# Patient Record
Sex: Female | Born: 1990 | Hispanic: Yes | Marital: Married | Smoking: Never smoker
Health system: Southern US, Community
[De-identification: ages and names within clinical notes are randomized; demographics above are authoritative.]

## PROBLEM LIST (undated history)

## (undated) DIAGNOSIS — Z789 Other specified health status: Secondary | ICD-10-CM

## (undated) HISTORY — PX: NO PAST SURGERIES: SHX2092

---

## 2008-05-27 ENCOUNTER — Ambulatory Visit (HOSPITAL_COMMUNITY): Admission: RE | Admit: 2008-05-27 | Discharge: 2008-05-27 | Payer: Self-pay | Admitting: Family Medicine

## 2008-06-30 ENCOUNTER — Ambulatory Visit (HOSPITAL_COMMUNITY): Admission: RE | Admit: 2008-06-30 | Discharge: 2008-06-30 | Payer: Self-pay | Admitting: Obstetrics & Gynecology

## 2008-07-28 ENCOUNTER — Ambulatory Visit (HOSPITAL_COMMUNITY): Admission: RE | Admit: 2008-07-28 | Discharge: 2008-07-28 | Payer: Self-pay | Admitting: Obstetrics & Gynecology

## 2008-08-26 ENCOUNTER — Inpatient Hospital Stay (HOSPITAL_COMMUNITY): Admission: AD | Admit: 2008-08-26 | Discharge: 2008-08-26 | Payer: Self-pay | Admitting: Obstetrics & Gynecology

## 2008-08-29 ENCOUNTER — Inpatient Hospital Stay (HOSPITAL_COMMUNITY): Admission: AD | Admit: 2008-08-29 | Discharge: 2008-08-29 | Payer: Self-pay | Admitting: Obstetrics & Gynecology

## 2008-08-29 ENCOUNTER — Ambulatory Visit: Payer: Self-pay | Admitting: Obstetrics and Gynecology

## 2008-09-02 ENCOUNTER — Ambulatory Visit (HOSPITAL_COMMUNITY): Admission: RE | Admit: 2008-09-02 | Discharge: 2008-09-02 | Payer: Self-pay | Admitting: Obstetrics & Gynecology

## 2008-09-09 ENCOUNTER — Ambulatory Visit (HOSPITAL_COMMUNITY): Admission: RE | Admit: 2008-09-09 | Discharge: 2008-09-09 | Payer: Self-pay | Admitting: Obstetrics & Gynecology

## 2008-09-12 ENCOUNTER — Inpatient Hospital Stay (HOSPITAL_COMMUNITY): Admission: AD | Admit: 2008-09-12 | Discharge: 2008-09-12 | Payer: Self-pay | Admitting: Gynecology

## 2008-09-16 ENCOUNTER — Ambulatory Visit (HOSPITAL_COMMUNITY): Admission: RE | Admit: 2008-09-16 | Discharge: 2008-09-16 | Payer: Self-pay | Admitting: Obstetrics & Gynecology

## 2008-09-19 ENCOUNTER — Ambulatory Visit (HOSPITAL_COMMUNITY): Admission: RE | Admit: 2008-09-19 | Discharge: 2008-09-19 | Payer: Self-pay | Admitting: Obstetrics and Gynecology

## 2008-09-22 ENCOUNTER — Encounter: Payer: Self-pay | Admitting: *Deleted

## 2008-09-22 ENCOUNTER — Inpatient Hospital Stay (HOSPITAL_COMMUNITY): Admission: AD | Admit: 2008-09-22 | Discharge: 2008-09-25 | Payer: Self-pay | Admitting: Obstetrics & Gynecology

## 2008-09-22 ENCOUNTER — Ambulatory Visit: Payer: Self-pay | Admitting: Family

## 2009-09-28 IMAGING — US US OB DETAIL+14 WK
1 series · 14 of 28 positions shown · non-contrast
Comparison: none

OBSTETRICAL ULTRASOUND:
 This ultrasound exam was performed in the [HOSPITAL] Ultrasound Department.  The OB US report was generated in the AS system, and faxed to the ordering physician.  This report is also available in [REDACTED] PACS.

[Series 1: us ob detail+14 wk · 0.28mm/px · 14 of 97 slices shown]
[im 4/97]
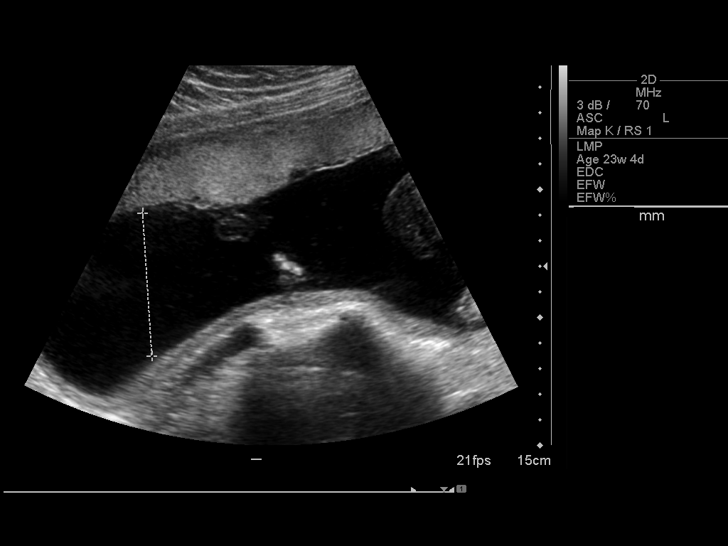
[im 11/97]
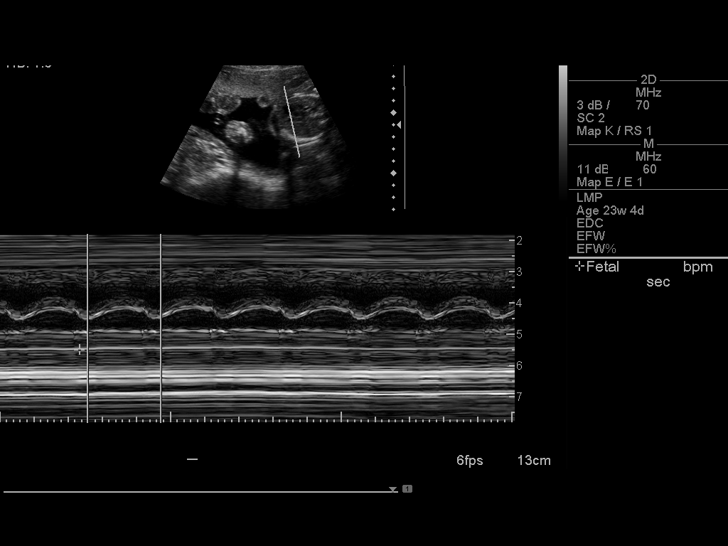
[im 18/97]
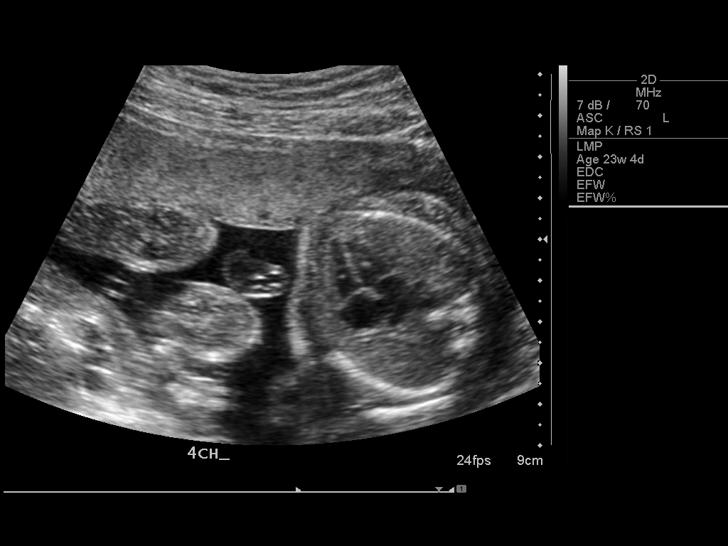
[im 25/97]
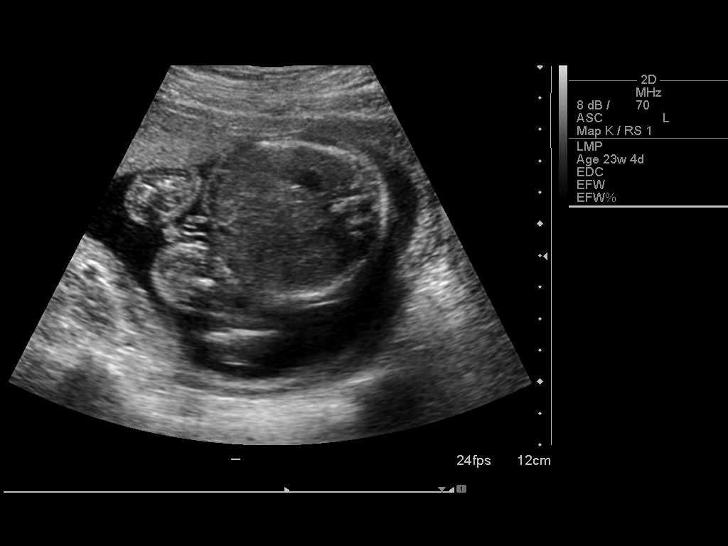
[im 33/97]
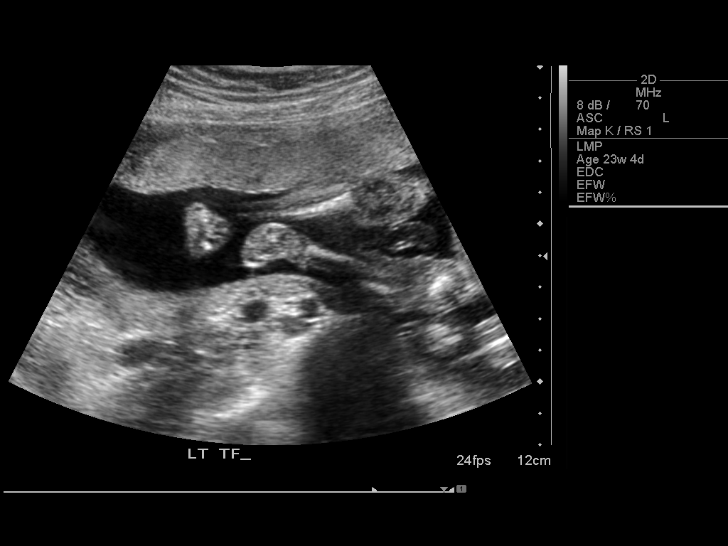
[im 40/97]
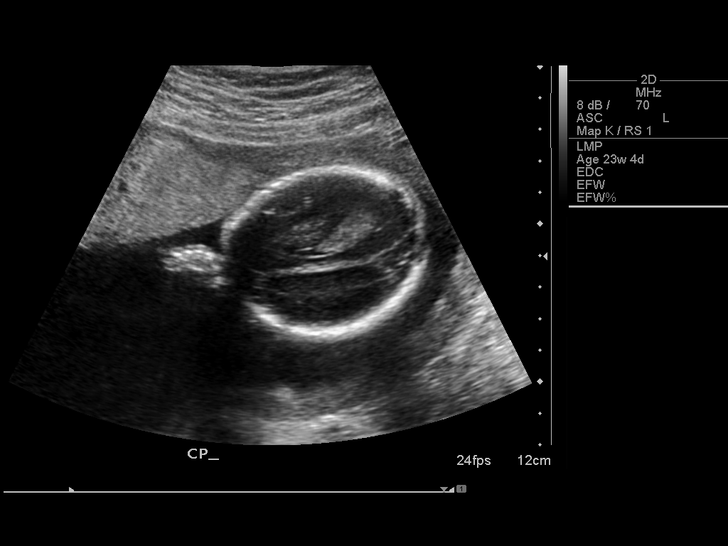
[im 47/97]
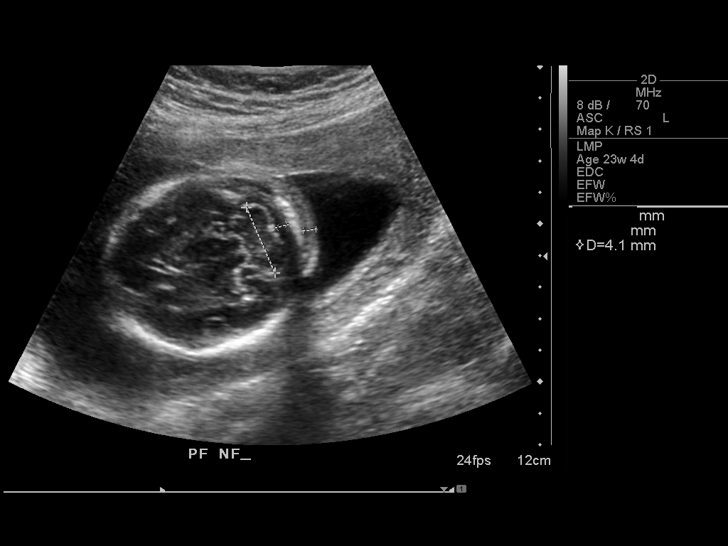
[im 54/97]
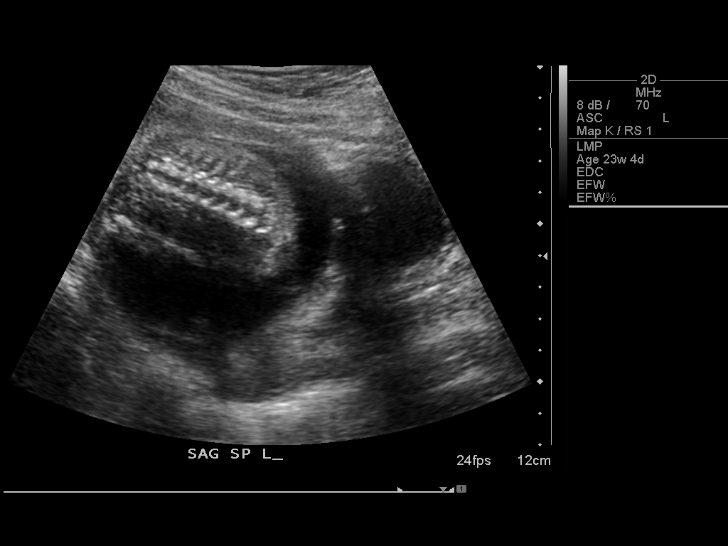
[im 61/97]
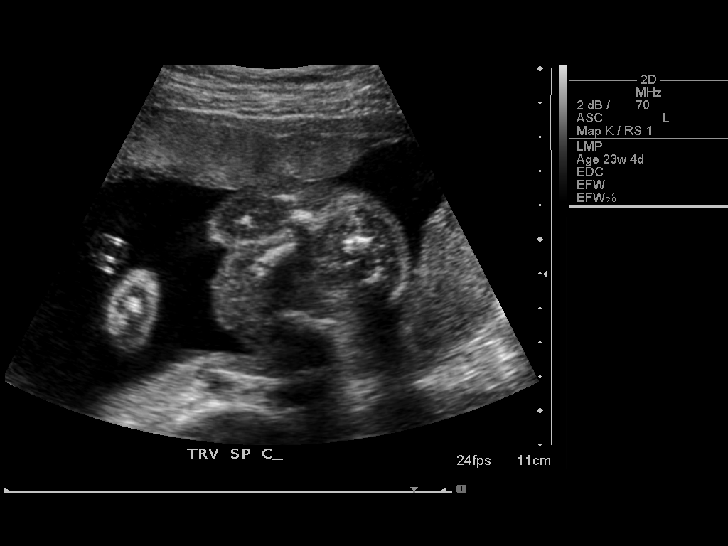
[im 68/97]
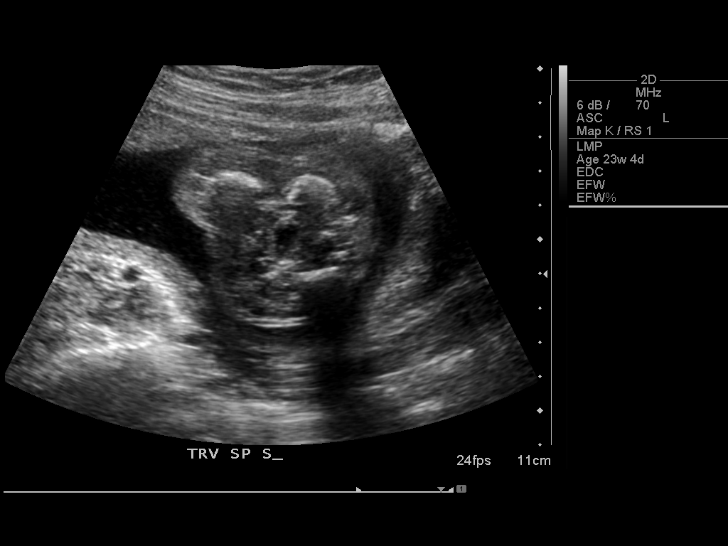
[im 75/97]
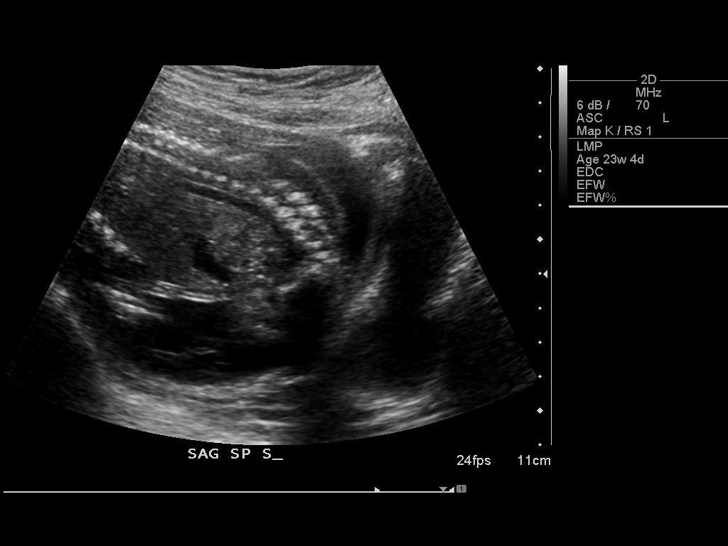
[im 82/97]
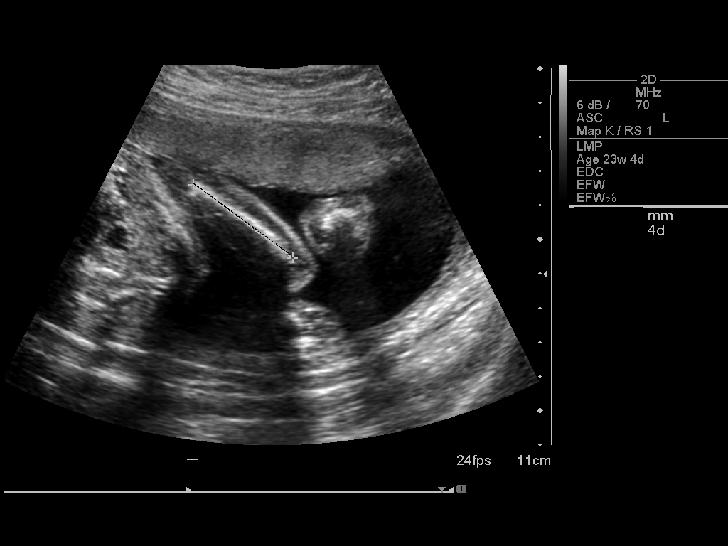
[im 89/97]
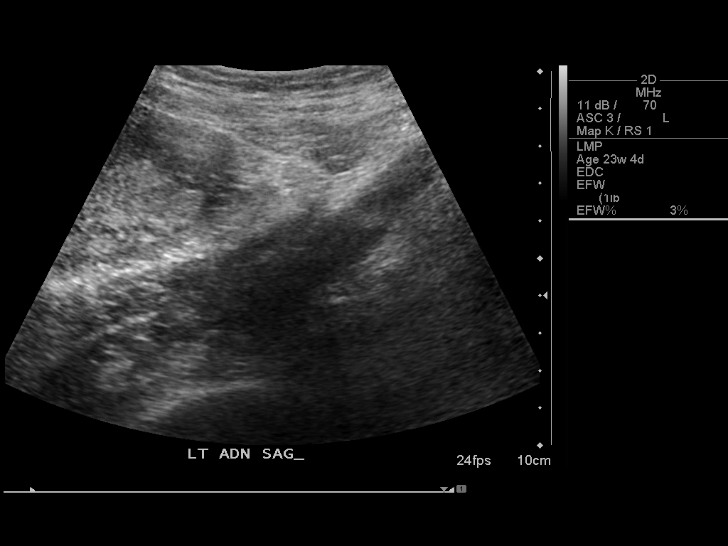
[im 97/97]
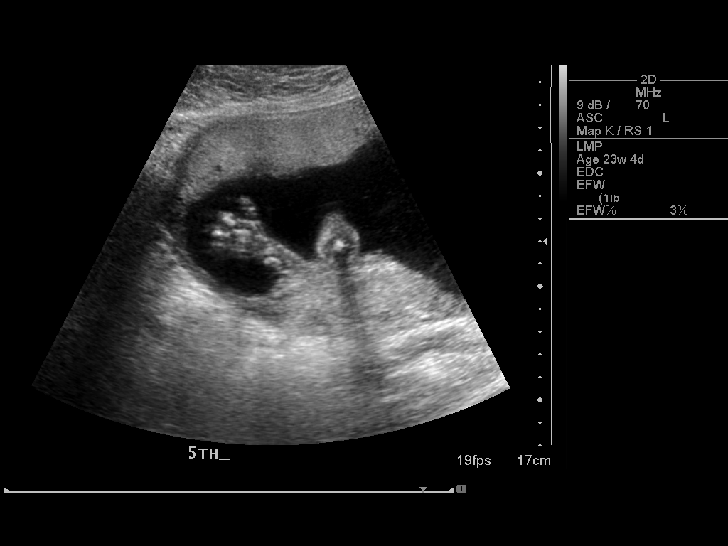

[14 of 28 positions shown; findings below may reference images not displayed]

IMPRESSION: See AS Obstetric US report.

## 2009-12-28 IMAGING — US US OB FOLLOW-UP
1 series · 14 of 28 positions shown · non-contrast
Comparison: none

OBSTETRICAL ULTRASOUND:
 This ultrasound was performed in The [HOSPITAL], and the AS OB/GYN report will be stored to [REDACTED] PACS.

[Series 1: us ob follow-up · 14 of 28 slices shown]
[im 2/28]
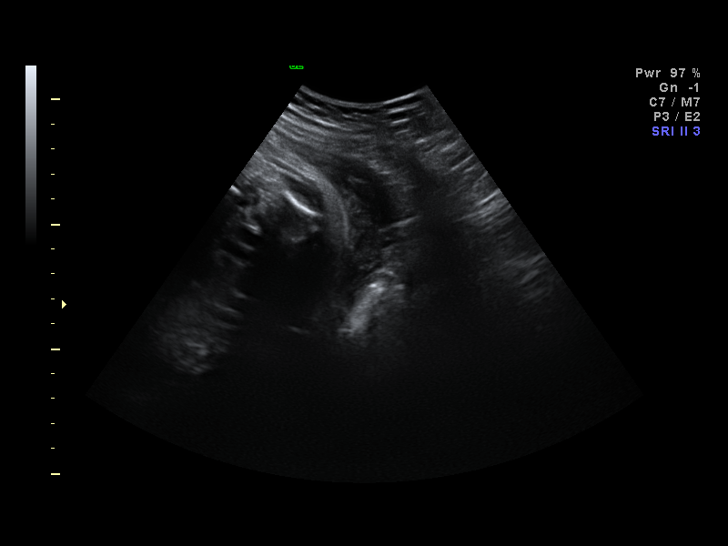
[im 4/28]
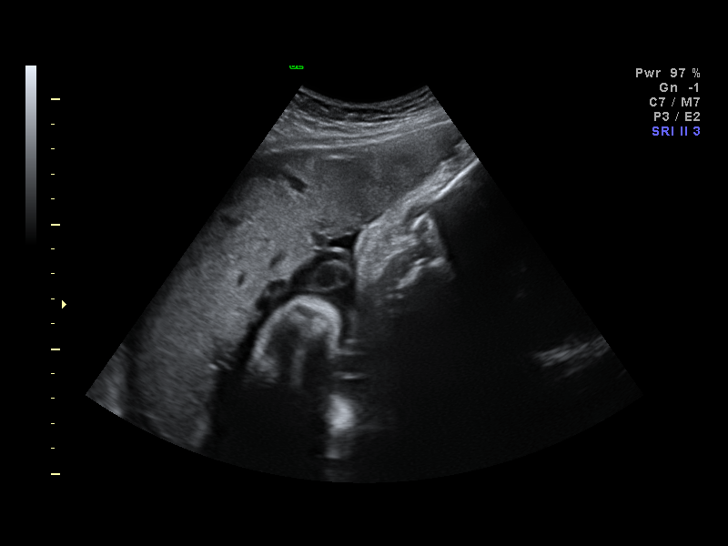
[im 6/28]
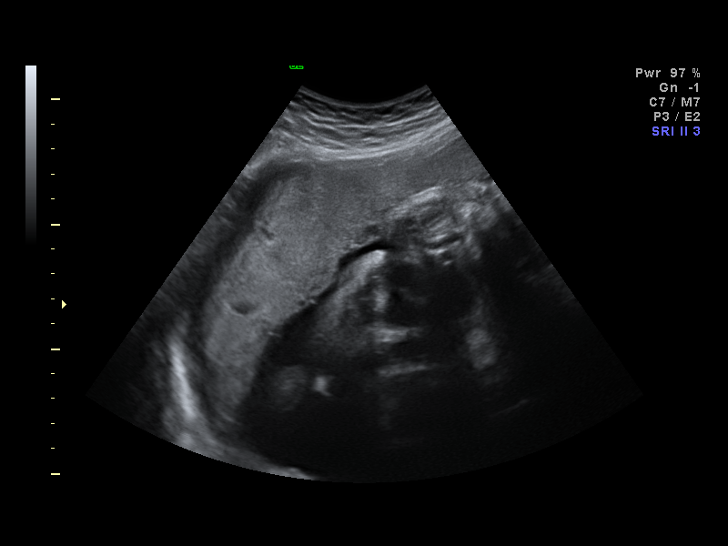
[im 8/28]
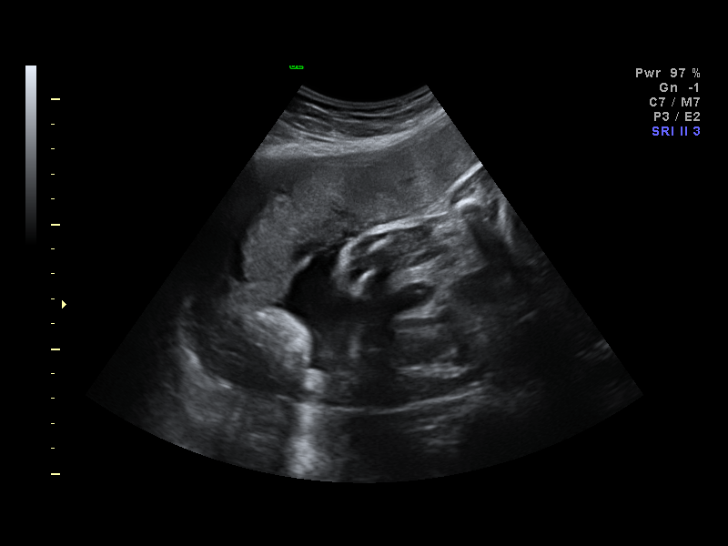
[im 10/28]
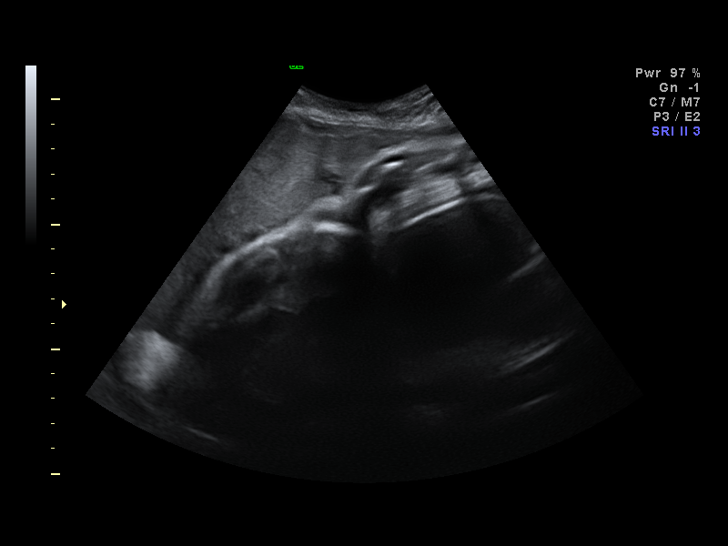
[im 12/28]
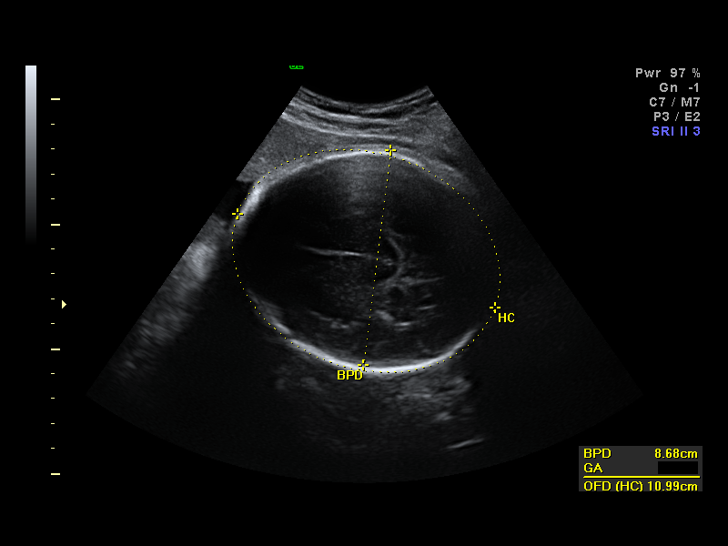
[im 14/28]
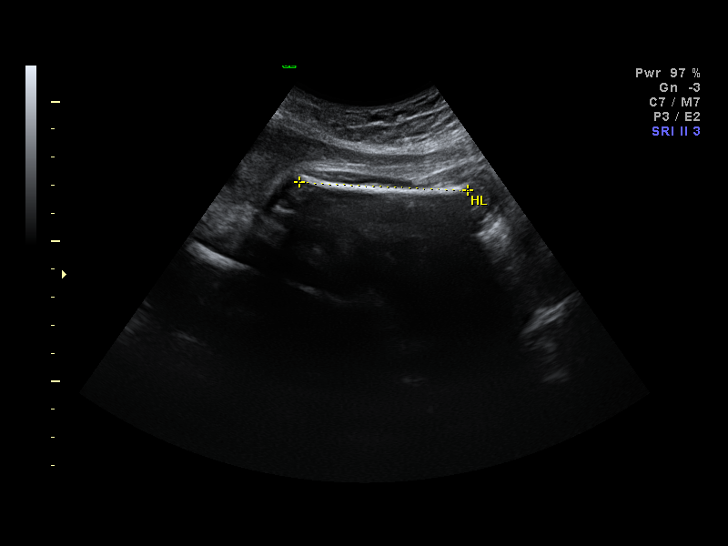
[im 16/28]
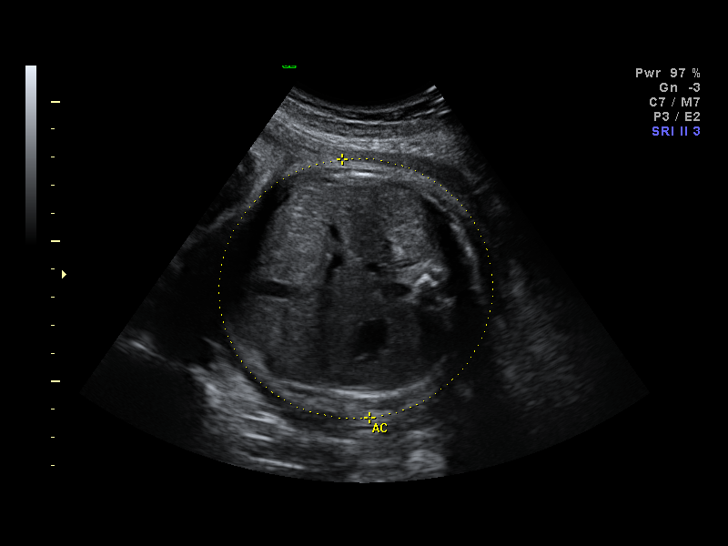
[im 18/28]
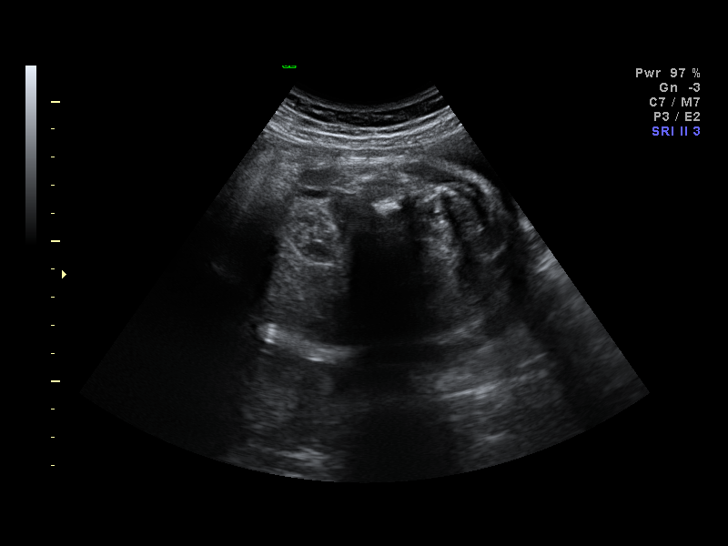
[im 20/28]
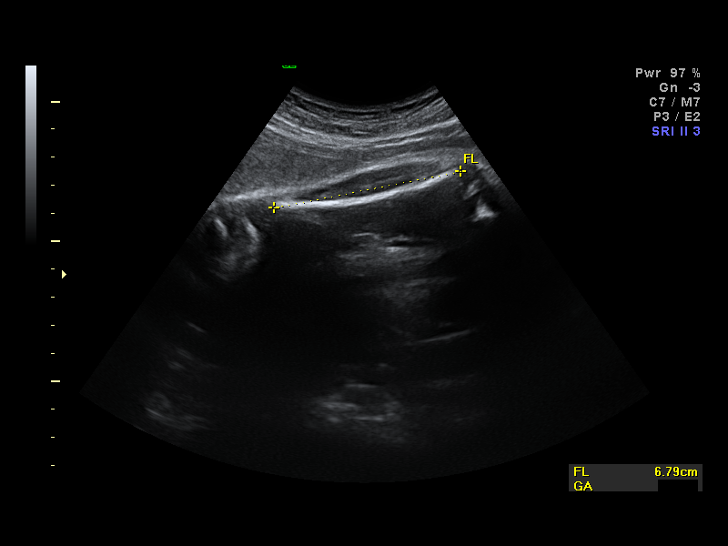
[im 22/28]
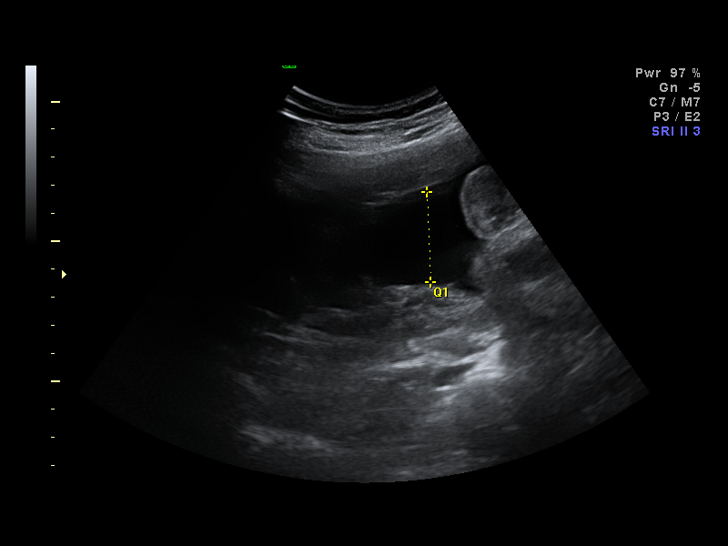
[im 24/28]
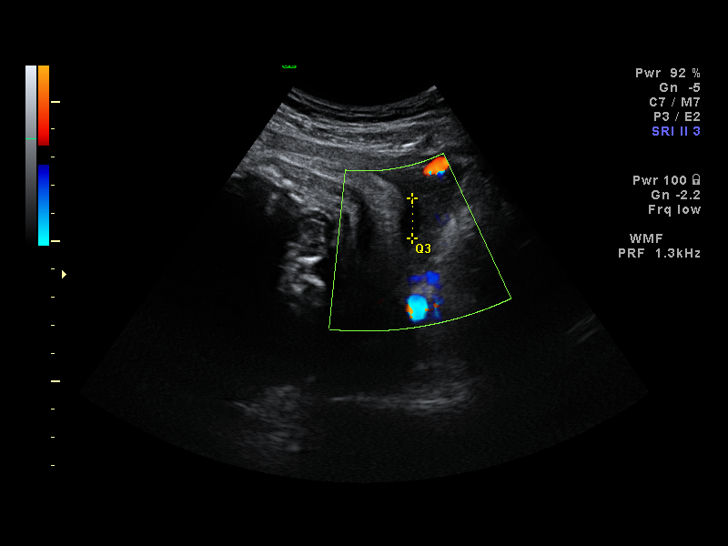
[im 26/28]
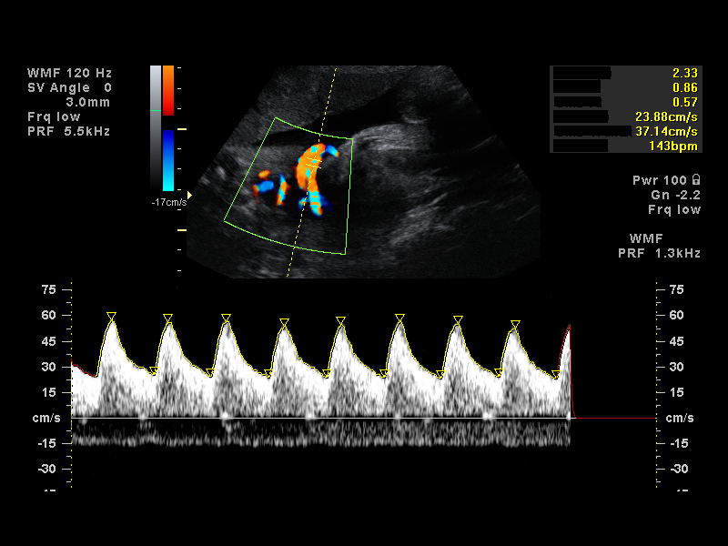
[im 28/28]
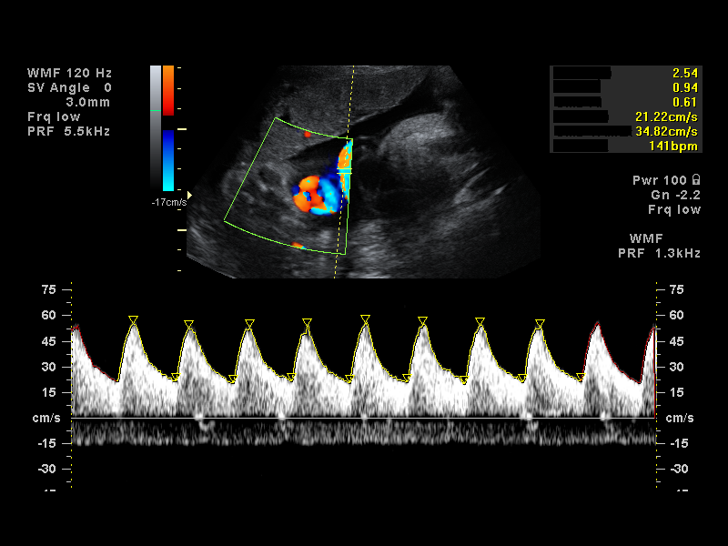

[14 of 28 positions shown; findings below may reference images not displayed]

IMPRESSION: AS OB/GYN has also been faxed to the ordering physician.

## 2010-01-04 IMAGING — US US OB LIMITED
1 series · 14 of 24 positions shown · non-contrast
Comparison: none

OBSTETRICAL ULTRASOUND:
 This ultrasound was performed in The [HOSPITAL], and the AS OB/GYN report will be stored to [REDACTED] PACS.

[Series 1: us ob limited · 14 of 24 slices shown]
[im 1/24]
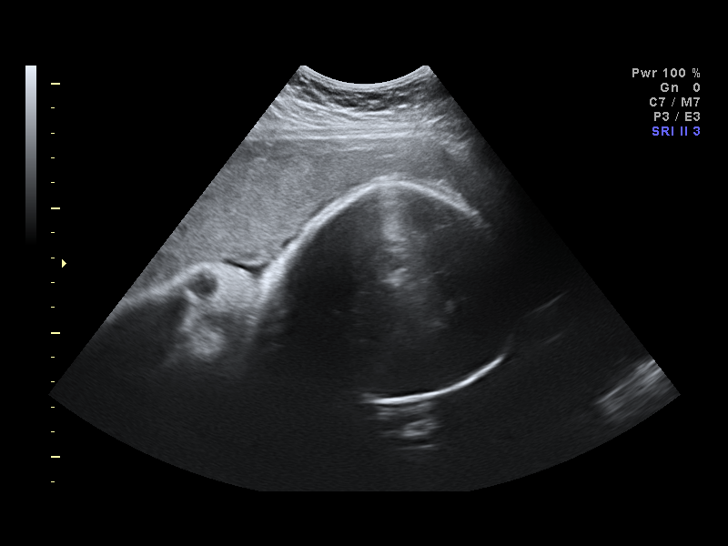
[im 3/24]
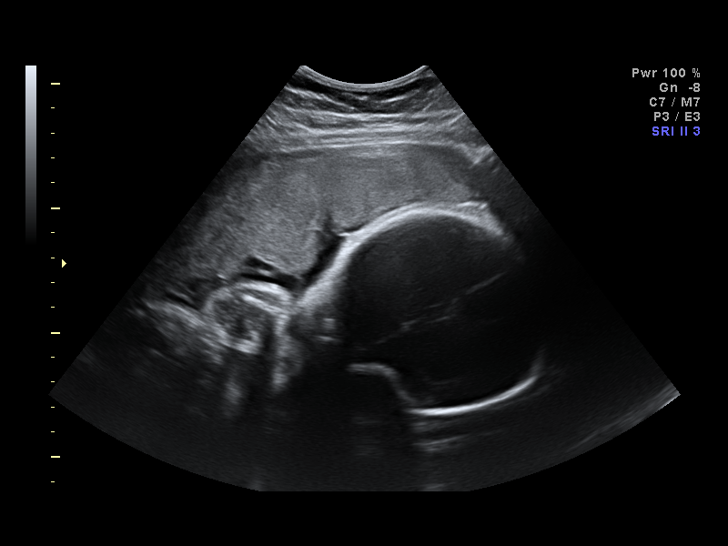
[im 5/24]
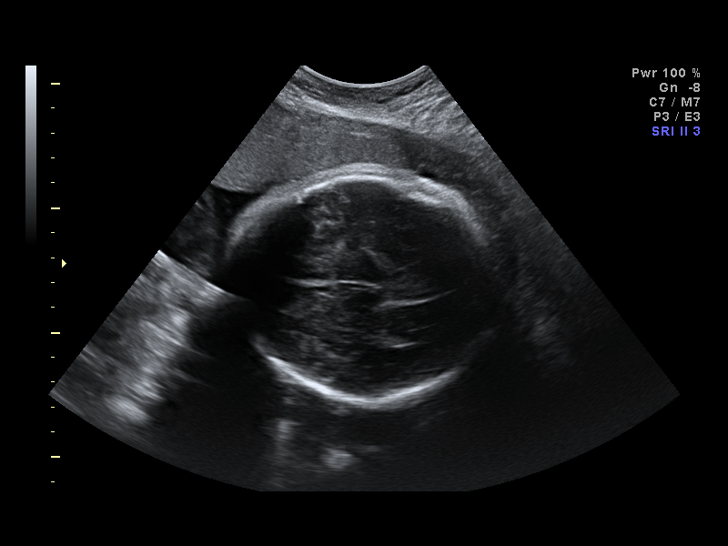
[im 7/24]
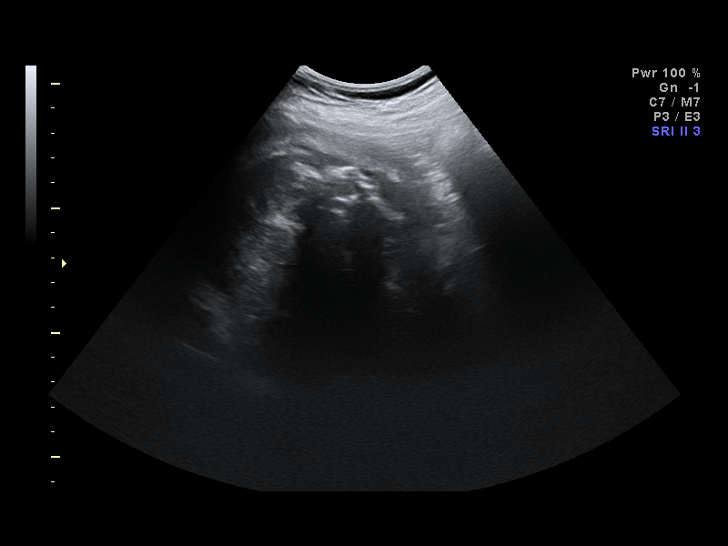
[im 8/24]
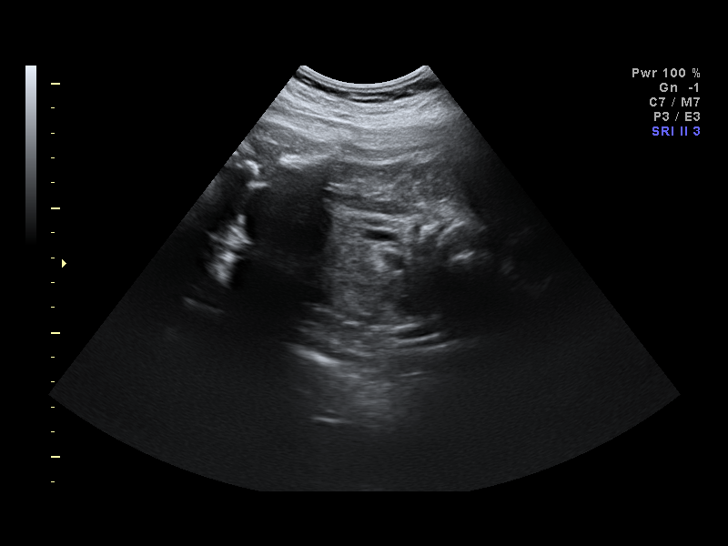
[im 10/24]
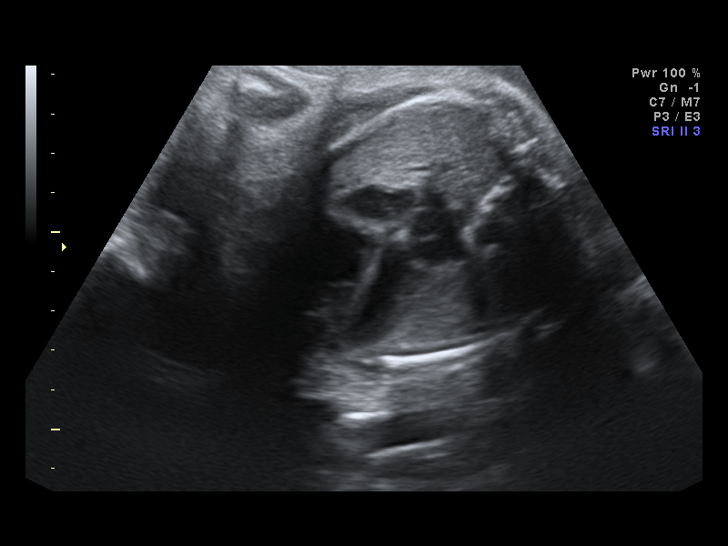
[im 12/24]
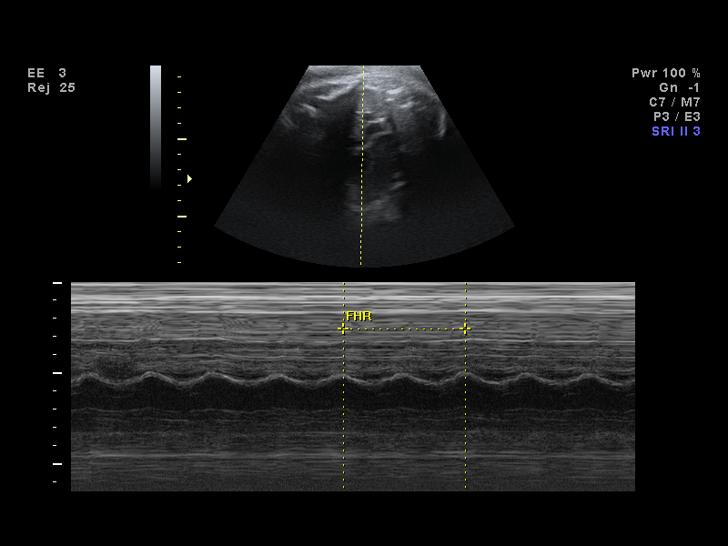
[im 13/24]
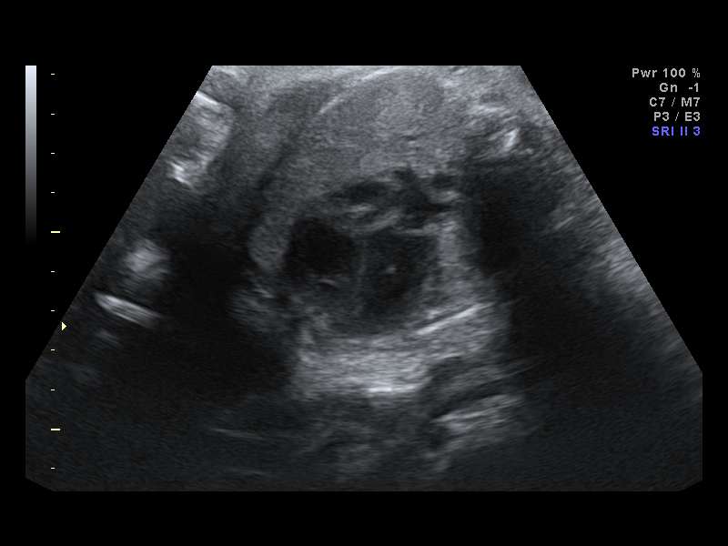
[im 15/24]
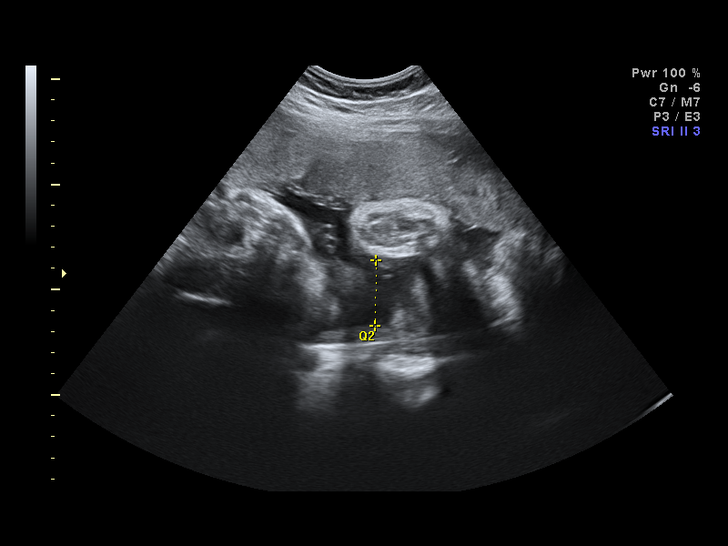
[im 17/24]
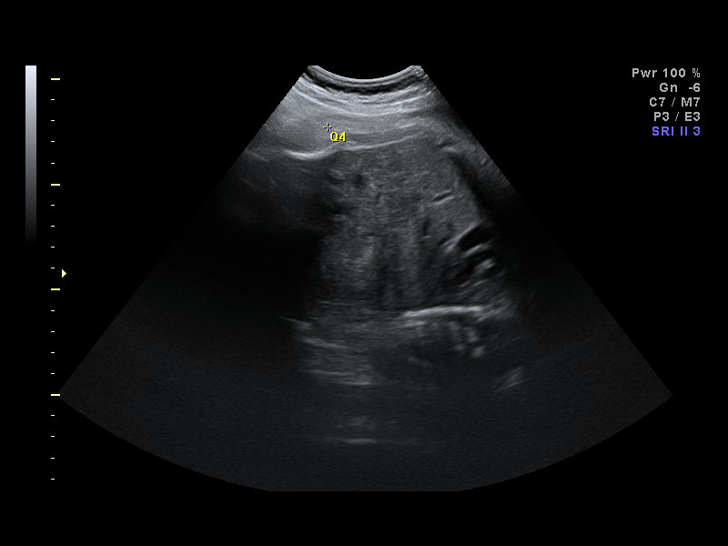
[im 19/24]
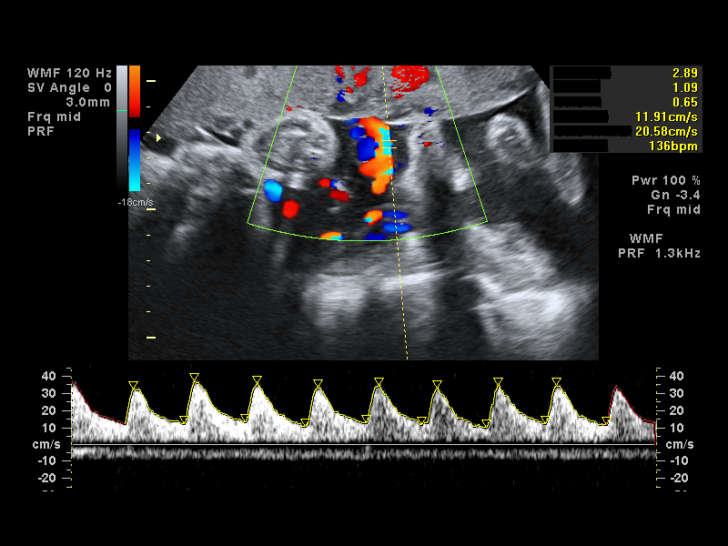
[im 20/24]
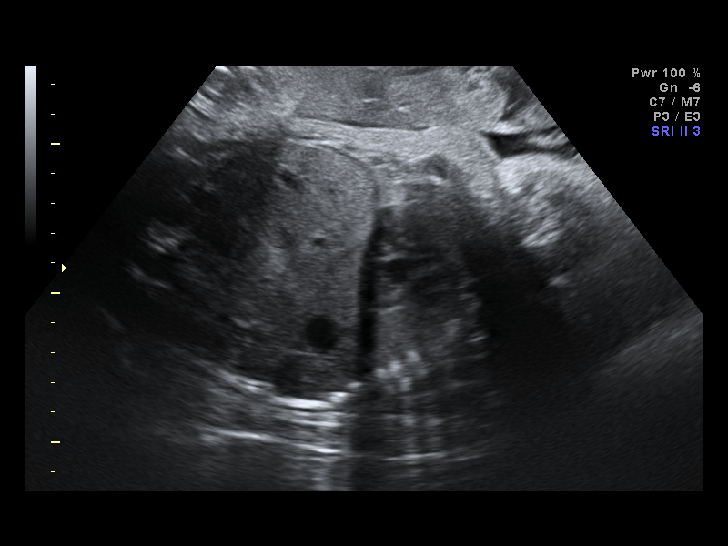
[im 22/24]
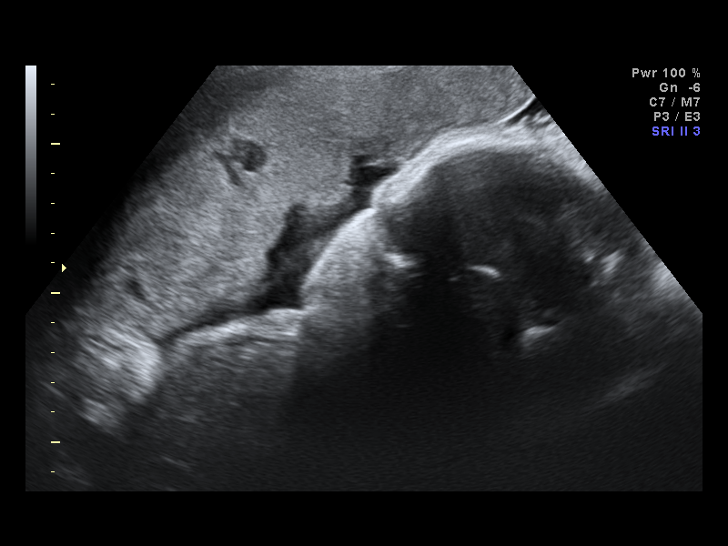
[im 24/24]
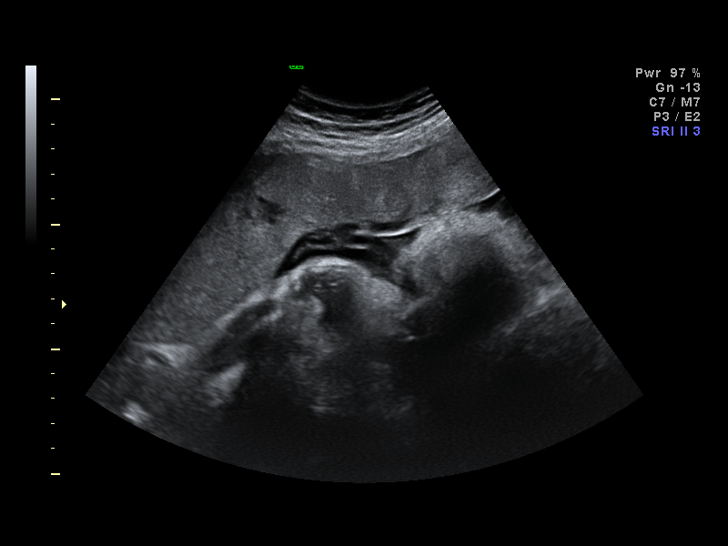

[14 of 24 positions shown; findings below may reference images not displayed]

IMPRESSION: AS OB/GYN has also been faxed to the ordering physician.

## 2011-09-19 LAB — RPR: RPR Ser Ql: NONREACTIVE

## 2011-09-19 LAB — CBC
HCT: 36
Platelets: 171
RBC: 3.13 — ABNORMAL LOW
RBC: 3.85 — ABNORMAL LOW
RDW: 13.4

## 2014-08-26 LAB — OB RESULTS CONSOLE RUBELLA ANTIBODY, IGM: RUBELLA: IMMUNE

## 2014-08-26 LAB — OB RESULTS CONSOLE GC/CHLAMYDIA
CHLAMYDIA, DNA PROBE: NEGATIVE
Gonorrhea: NEGATIVE

## 2014-08-26 LAB — OB RESULTS CONSOLE RPR: RPR: NONREACTIVE

## 2014-08-26 LAB — OB RESULTS CONSOLE ABO/RH: RH Type: POSITIVE

## 2014-08-26 LAB — OB RESULTS CONSOLE HIV ANTIBODY (ROUTINE TESTING): HIV: NONREACTIVE

## 2014-08-26 LAB — OB RESULTS CONSOLE ANTIBODY SCREEN: Antibody Screen: NEGATIVE

## 2014-08-26 LAB — OB RESULTS CONSOLE HEPATITIS B SURFACE ANTIGEN: HEP B S AG: NEGATIVE

## 2014-12-19 NOTE — L&D Delivery Note (Signed)
Delivery Note At 5:34 AM a viable female was delivered via Vaginal, Spontaneous Delivery (Presentation: Left Occiput Anterior).  APGAR: 9, 9; weight  .   Placenta status: Intact, Spontaneous.  Cord: 3 vessels with the following complications: None.  Cord pH: none  Anesthesia: Epidural Local  Episiotomy: None Lacerations: 2nd degree;Perineal Suture Repair: 2.0 chromic Est. Blood Loss (mL): 300  Mom to postpartum.  Baby to Couplet care / Skin to Skin.  Diana Nunez A 03/16/2015, 6:38 AM

## 2015-02-12 LAB — OB RESULTS CONSOLE GBS: GBS: NEGATIVE

## 2015-03-13 ENCOUNTER — Encounter (HOSPITAL_COMMUNITY): Payer: Self-pay | Admitting: *Deleted

## 2015-03-13 ENCOUNTER — Telehealth (HOSPITAL_COMMUNITY): Payer: Self-pay | Admitting: *Deleted

## 2015-03-13 NOTE — Telephone Encounter (Signed)
Preadmission screen Interpreter number [phone]

## 2015-03-14 ENCOUNTER — Inpatient Hospital Stay (HOSPITAL_COMMUNITY)
Admission: AD | Admit: 2015-03-14 | Discharge: 2015-03-14 | Disposition: A | Discharge status: Home / Self Care | Payer: Self-pay | Source: Ambulatory Visit | Attending: Obstetrics | Admitting: Obstetrics

## 2015-03-14 ENCOUNTER — Encounter (HOSPITAL_COMMUNITY): Payer: Self-pay | Admitting: *Deleted

## 2015-03-14 DIAGNOSIS — O4693 Antepartum hemorrhage, unspecified, third trimester: Secondary | ICD-10-CM | POA: Insufficient documentation

## 2015-03-14 DIAGNOSIS — R109 Unspecified abdominal pain: Secondary | ICD-10-CM | POA: Insufficient documentation

## 2015-03-14 DIAGNOSIS — M549 Dorsalgia, unspecified: Secondary | ICD-10-CM | POA: Insufficient documentation

## 2015-03-14 DIAGNOSIS — Z3A40 40 weeks gestation of pregnancy: Secondary | ICD-10-CM | POA: Insufficient documentation

## 2015-03-14 HISTORY — DX: Other specified health status: Z78.9

## 2015-03-14 NOTE — Progress Notes (Signed)
Assisted RN with interpretation of discharge instructions.  °Spanish interpreter  °

## 2015-03-14 NOTE — Progress Notes (Signed)
Assisted Pharmacy Tech with interpretation of medication information.  Spanish interpreter

## 2015-03-14 NOTE — MAU Note (Signed)
Pt reports she had some blood when wiped last night and this morning. C/o some back pain and cramping on and off since last night.

## 2015-03-14 NOTE — Progress Notes (Signed)
Assisted admissions with interpretation of patient registration.  Spanish Interpeter

## 2015-03-14 NOTE — Progress Notes (Signed)
Assisted RN with interpretation of patient assessment.   °Spanish Interpreter °

## 2015-03-15 ENCOUNTER — Encounter (HOSPITAL_COMMUNITY): Payer: Self-pay | Admitting: *Deleted

## 2015-03-15 ENCOUNTER — Inpatient Hospital Stay (HOSPITAL_COMMUNITY)
Admission: AD | Admit: 2015-03-15 | Discharge: 2015-03-17 | DRG: 775 | Disposition: A | Discharge status: Home / Self Care | Payer: Medicaid Other | Source: Ambulatory Visit | Attending: Obstetrics | Admitting: Obstetrics

## 2015-03-15 ENCOUNTER — Inpatient Hospital Stay (HOSPITAL_COMMUNITY): Payer: Medicaid Other | Admitting: Anesthesiology

## 2015-03-15 DIAGNOSIS — Z3A40 40 weeks gestation of pregnancy: Secondary | ICD-10-CM | POA: Diagnosis present

## 2015-03-15 DIAGNOSIS — IMO0001 Reserved for inherently not codable concepts without codable children: Secondary | ICD-10-CM

## 2015-03-15 LAB — CBC
HEMATOCRIT: 36.9 % (ref 36.0–46.0)
Hemoglobin: 12.7 g/dL (ref 12.0–15.0)
MCH: 31.4 pg (ref 26.0–34.0)
MCHC: 34.4 g/dL (ref 30.0–36.0)
MCV: 91.1 fL (ref 78.0–100.0)
PLATELETS: 178 10*3/uL (ref 150–400)
RBC: 4.05 MIL/uL (ref 3.87–5.11)
RDW: 13.8 % (ref 11.5–15.5)
WBC: 11.1 10*3/uL — AB (ref 4.0–10.5)

## 2015-03-15 LAB — TYPE AND SCREEN
ABO/RH(D): O POS
Antibody Screen: NEGATIVE

## 2015-03-15 MED ORDER — PHENYLEPHRINE 40 MCG/ML (10ML) SYRINGE FOR IV PUSH (FOR BLOOD PRESSURE SUPPORT)
PREFILLED_SYRINGE | INTRAVENOUS | Status: AC
  Filled 2015-03-15: qty 20

## 2015-03-15 MED ORDER — OXYCODONE-ACETAMINOPHEN 5-325 MG PO TABS: 1.0000 | ORAL_TABLET | ORAL | Status: DC | PRN

## 2015-03-15 MED ORDER — EPHEDRINE 5 MG/ML INJ
10.0000 mg | INTRAVENOUS | Status: DC | PRN
  Filled 2015-03-15: qty 2

## 2015-03-15 MED ORDER — FENTANYL 2.5 MCG/ML BUPIVACAINE 1/10 % EPIDURAL INFUSION (WH - ANES)
INTRAMUSCULAR | Status: AC
  Filled 2015-03-15: qty 125

## 2015-03-15 MED ORDER — OXYTOCIN 40 UNITS IN LACTATED RINGERS INFUSION - SIMPLE MED
62.5000 mL/h | INTRAVENOUS | Status: DC
  Filled 2015-03-15: qty 1000

## 2015-03-15 MED ORDER — PHENYLEPHRINE 40 MCG/ML (10ML) SYRINGE FOR IV PUSH (FOR BLOOD PRESSURE SUPPORT)
80.0000 ug | PREFILLED_SYRINGE | INTRAVENOUS | Status: DC | PRN
  Filled 2015-03-15: qty 2

## 2015-03-15 MED ORDER — OXYCODONE-ACETAMINOPHEN 5-325 MG PO TABS: 2.0000 | ORAL_TABLET | ORAL | Status: DC | PRN

## 2015-03-15 MED ORDER — LACTATED RINGERS IV SOLN
500.0000 mL | Freq: Once | INTRAVENOUS | Status: AC
  Administered 2015-03-15: 500 mL via INTRAVENOUS

## 2015-03-15 MED ORDER — FENTANYL 2.5 MCG/ML BUPIVACAINE 1/10 % EPIDURAL INFUSION (WH - ANES)
14.0000 mL/h | INTRAMUSCULAR | Status: DC | PRN
  Administered 2015-03-15: 14 mL/h via EPIDURAL

## 2015-03-15 MED ORDER — DIPHENHYDRAMINE HCL 50 MG/ML IJ SOLN: 12.5000 mg | INTRAMUSCULAR | Status: DC | PRN

## 2015-03-15 MED ORDER — LIDOCAINE HCL (PF) 1 % IJ SOLN
30.0000 mL | INTRAMUSCULAR | Status: DC | PRN
  Administered 2015-03-16: 30 mL via SUBCUTANEOUS
  Filled 2015-03-15: qty 30

## 2015-03-15 MED ORDER — FLEET ENEMA 7-19 GM/118ML RE ENEM: 1.0000 | ENEMA | RECTAL | Status: DC | PRN

## 2015-03-15 MED ORDER — LIDOCAINE HCL (PF) 1 % IJ SOLN
INTRAMUSCULAR | Status: DC | PRN
  Administered 2015-03-15 (×2): 4 mL

## 2015-03-15 MED ORDER — ONDANSETRON HCL 4 MG/2ML IJ SOLN: 4.0000 mg | Freq: Four times a day (QID) | INTRAMUSCULAR | Status: DC | PRN

## 2015-03-15 MED ORDER — ACETAMINOPHEN 325 MG PO TABS: 650.0000 mg | ORAL_TABLET | ORAL | Status: DC | PRN

## 2015-03-15 MED ORDER — LACTATED RINGERS IV SOLN
INTRAVENOUS | Status: DC
  Administered 2015-03-15 – 2015-03-16 (×2): via INTRAVENOUS

## 2015-03-15 MED ORDER — OXYTOCIN BOLUS FROM INFUSION
500.0000 mL | INTRAVENOUS | Status: DC
  Administered 2015-03-16: 500 mL via INTRAVENOUS

## 2015-03-15 MED ORDER — CITRIC ACID-SODIUM CITRATE 334-500 MG/5ML PO SOLN: 30.0000 mL | ORAL | Status: DC | PRN

## 2015-03-15 MED ORDER — FENTANYL 2.5 MCG/ML BUPIVACAINE 1/10 % EPIDURAL INFUSION (WH - ANES)
INTRAMUSCULAR | Status: DC | PRN
  Administered 2015-03-15: 14 mL/h via EPIDURAL

## 2015-03-15 MED ORDER — LACTATED RINGERS IV SOLN: 500.0000 mL | INTRAVENOUS | Status: DC | PRN

## 2015-03-15 NOTE — Progress Notes (Signed)
Assisted RN with interpretation of admit into birthing suite.  Spanish Interpreter

## 2015-03-15 NOTE — Progress Notes (Signed)
Assisted RN with interpretation of blood work ordered and IV. Spanish interpreter

## 2015-03-15 NOTE — Anesthesia Procedure Notes (Signed)
Epidural Patient location during procedure: OB Start time: 03/15/2015 11:19 PM  Staffing Anesthesiologist: Mal AmabileFOSTER, Trinidy Masterson Performed by: anesthesiologist   Preanesthetic Checklist Completed: patient identified, site marked, surgical consent, pre-op evaluation, timeout performed, IV checked, risks and benefits discussed and monitors and equipment checked  Epidural Patient position: sitting Prep: site prepped and draped and DuraPrep Patient monitoring: continuous pulse ox and blood pressure Approach: midline Location: L3-L4 Injection technique: LOR air  Needle:  Needle type: Tuohy  Needle gauge: 17 G Needle length: 9 cm and 9 Needle insertion depth: 4 cm Catheter type: closed end flexible Catheter size: 19 Gauge Catheter at skin depth: 9 cm Test dose: negative and Other  Assessment Events: blood not aspirated, injection not painful, no injection resistance, negative IV test and no paresthesia  Additional Notes Patient identified. Risks and benefits discussed including failed block, incomplete  Pain control, post dural puncture headache, nerve damage, paralysis, blood pressure Changes, nausea, vomiting, reactions to medications-both toxic and allergic and post Partum back pain. All questions were answered. Patient expressed understanding and wished to proceed. Sterile technique was used throughout procedure. Epidural site was Dressed with sterile barrier dressing. No paresthesias, signs of intravascular injection Or signs of intrathecal spread were encountered.  Patient was more comfortable after the epidural was dosed. Please see RN's note for documentation of vital signs and FHR which are stable.

## 2015-03-15 NOTE — Progress Notes (Signed)
Assisted admissions with interpretation of registration.  Spanish interpreter

## 2015-03-15 NOTE — Progress Notes (Signed)
Assisted RN with interpretation of patient assessment.   °Spanish Interpreter °

## 2015-03-15 NOTE — Anesthesia Preprocedure Evaluation (Signed)
Anesthesia Evaluation  Patient identified by MRN, date of birth, ID band Patient awake    Reviewed: Allergy & Precautions, Patient's Chart, lab work & pertinent test results  Airway Mallampati: II  TM Distance: >3 FB Neck ROM: Full    Dental no notable dental hx. (+) Teeth Intact   Pulmonary neg pulmonary ROS,  breath sounds clear to auscultation  Pulmonary exam normal       Cardiovascular negative cardio ROS  Rhythm:Regular Rate:Normal     Neuro/Psych negative neurological ROS  negative psych ROS   GI/Hepatic negative GI ROS, Neg liver ROS,   Endo/Other  Obesity  Renal/GU negative Renal ROS  negative genitourinary   Musculoskeletal negative musculoskeletal ROS (+)   Abdominal (+) + obese,   Peds  Hematology negative hematology ROS (+)   Anesthesia Other Findings   Reproductive/Obstetrics (+) Pregnancy                             Anesthesia Physical Anesthesia Plan  ASA: II  Anesthesia Plan: Epidural   Post-op Pain Management:    Induction:   Airway Management Planned: Natural Airway  Additional Equipment:   Intra-op Plan:   Post-operative Plan:   Informed Consent: I have reviewed the patients History and Physical, chart, labs and discussed the procedure including the risks, benefits and alternatives for the proposed anesthesia with the patient or authorized representative who has indicated his/her understanding and acceptance.     Plan Discussed with: Anesthesiologist  Anesthesia Plan Comments:         Anesthesia Quick Evaluation

## 2015-03-15 NOTE — H&P (Signed)
Diana Nunez is a 24 y.o. female presenting for UC's. Maternal Medical History:  Reason for admission: Contractions.   Fetal activity: Perceived fetal activity is normal.   Last perceived fetal movement was within the past hour.    Prenatal complications: no prenatal complications Prenatal Complications - Diabetes: none.    OB History    Gravida Para Term Preterm AB TAB SAB Ectopic Multiple Living   2 1 1       1      Past Medical History  Diagnosis Date  . Medical history non-contributory    Past Surgical History  Procedure Laterality Date  . No past surgeries     Family History: family history is not on file. Social History:  reports that she has never smoked. She does not have any smokeless tobacco history on file. She reports that she does not drink alcohol or use illicit drugs.   Prenatal Transfer Tool  Maternal Diabetes: No Genetic Screening: Denied Maternal Ultrasounds/Referrals: Normal Fetal Ultrasounds or other Referrals:  None Maternal Substance Abuse:  No Significant Maternal Medications:  None Significant Maternal Lab Results:  None Other Comments:  None  ROS  Dilation: 4.5 Effacement (%): 100 Station: -2 Exam by:: Elie ConferK. Weiss RN Blood pressure 115/76, pulse 93, temperature 98 F (36.7 C), resp. rate 16, height 5\' 2"  (1.575 m), weight 183 lb (83.008 kg), last menstrual period 06/09/2014. Maternal Exam:  Abdomen: Patient reports no abdominal tenderness. Fetal presentation: vertex  Introitus: Normal vulva. Normal vagina.  Pelvis: adequate for delivery.   Cervix: Cervix evaluated by digital exam.     Physical Exam  Nursing note and vitals reviewed. Constitutional: She is oriented to person, place, and time. She appears well-developed and well-nourished.  HENT:  Head: Normocephalic and atraumatic.  Eyes: Conjunctivae are normal. Pupils are equal, round, and reactive to light.  Neck: Normal range of motion. Neck supple.  Cardiovascular:  Normal rate and regular rhythm.   Respiratory: Effort normal and breath sounds normal.  GI: Soft.  Genitourinary: Vagina normal and uterus normal.  Musculoskeletal: Normal range of motion.  Neurological: She is alert and oriented to person, place, and time.  Skin: Skin is warm and dry.  Psychiatric: She has a normal mood and affect. Her behavior is normal. Judgment and thought content normal.    Prenatal labs: ABO, Rh: O/Positive/-- (09/08 0000) Antibody: Negative (09/08 0000) Rubella: Immune (09/08 0000) RPR: Nonreactive (09/08 0000)  HBsAg: Negative (09/08 0000)  HIV: Non-reactive (09/08 0000)  GBS: Negative (02/25 0000)   Assessment/Plan: 40.6 weeks.  Active labor.  Admit.   Kreed Kauffman A 03/15/2015, 10:19 PM

## 2015-03-16 ENCOUNTER — Encounter (HOSPITAL_COMMUNITY): Payer: Self-pay | Admitting: *Deleted

## 2015-03-16 LAB — ABO/RH: ABO/RH(D): O POS

## 2015-03-16 LAB — RPR: RPR Ser Ql: NONREACTIVE

## 2015-03-16 MED ORDER — OXYCODONE-ACETAMINOPHEN 5-325 MG PO TABS
1.0000 | ORAL_TABLET | ORAL | Status: DC | PRN
  Administered 2015-03-16 – 2015-03-17 (×2): 1 via ORAL
  Filled 2015-03-16 (×2): qty 1

## 2015-03-16 MED ORDER — INFLUENZA VAC SPLIT QUAD 0.5 ML IM SUSY
0.5000 mL | PREFILLED_SYRINGE | INTRAMUSCULAR | Status: AC
  Administered 2015-03-17: 0.5 mL via INTRAMUSCULAR
  Filled 2015-03-16: qty 0.5

## 2015-03-16 MED ORDER — SIMETHICONE 80 MG PO CHEW: 80.0000 mg | CHEWABLE_TABLET | ORAL | Status: DC | PRN

## 2015-03-16 MED ORDER — DIBUCAINE 1 % RE OINT: 1.0000 "application " | TOPICAL_OINTMENT | RECTAL | Status: DC | PRN

## 2015-03-16 MED ORDER — ONDANSETRON HCL 4 MG/2ML IJ SOLN: 4.0000 mg | INTRAMUSCULAR | Status: DC | PRN

## 2015-03-16 MED ORDER — PRENATAL MULTIVITAMIN CH
1.0000 | ORAL_TABLET | Freq: Every day | ORAL | Status: DC
  Administered 2015-03-16 – 2015-03-17 (×2): 1 via ORAL
  Filled 2015-03-16 (×2): qty 1

## 2015-03-16 MED ORDER — ZOLPIDEM TARTRATE 5 MG PO TABS: 5.0000 mg | ORAL_TABLET | Freq: Every evening | ORAL | Status: DC | PRN

## 2015-03-16 MED ORDER — TETANUS-DIPHTH-ACELL PERTUSSIS 5-2.5-18.5 LF-MCG/0.5 IM SUSP
0.5000 mL | Freq: Once | INTRAMUSCULAR | Status: DC
Start: 2015-03-17 — End: 2015-03-17

## 2015-03-16 MED ORDER — IBUPROFEN 600 MG PO TABS
600.0000 mg | ORAL_TABLET | Freq: Four times a day (QID) | ORAL | Status: DC
  Administered 2015-03-16 – 2015-03-17 (×5): 600 mg via ORAL
  Filled 2015-03-16 (×5): qty 1

## 2015-03-16 MED ORDER — OXYCODONE-ACETAMINOPHEN 5-325 MG PO TABS: 2.0000 | ORAL_TABLET | ORAL | Status: DC | PRN

## 2015-03-16 MED ORDER — DIPHENHYDRAMINE HCL 25 MG PO CAPS: 25.0000 mg | ORAL_CAPSULE | Freq: Four times a day (QID) | ORAL | Status: DC | PRN

## 2015-03-16 MED ORDER — LANOLIN HYDROUS EX OINT: TOPICAL_OINTMENT | CUTANEOUS | Status: DC | PRN

## 2015-03-16 MED ORDER — WITCH HAZEL-GLYCERIN EX PADS: 1.0000 "application " | MEDICATED_PAD | CUTANEOUS | Status: DC | PRN

## 2015-03-16 MED ORDER — BENZOCAINE-MENTHOL 20-0.5 % EX AERO: 1.0000 "application " | INHALATION_SPRAY | CUTANEOUS | Status: DC | PRN

## 2015-03-16 MED ORDER — OXYTOCIN 40 UNITS IN LACTATED RINGERS INFUSION - SIMPLE MED: 62.5000 mL/h | INTRAVENOUS | Status: DC | PRN

## 2015-03-16 MED ORDER — ONDANSETRON HCL 4 MG PO TABS: 4.0000 mg | ORAL_TABLET | ORAL | Status: DC | PRN

## 2015-03-16 MED ORDER — TETANUS-DIPHTH-ACELL PERTUSSIS 5-2.5-18.5 LF-MCG/0.5 IM SUSP
0.5000 mL | Freq: Once | INTRAMUSCULAR | Status: AC
  Administered 2015-03-16: 0.5 mL via INTRAMUSCULAR
  Filled 2015-03-16: qty 0.5

## 2015-03-16 MED ORDER — SENNOSIDES-DOCUSATE SODIUM 8.6-50 MG PO TABS
2.0000 | ORAL_TABLET | ORAL | Status: DC
  Administered 2015-03-17: 2 via ORAL
  Filled 2015-03-16: qty 2

## 2015-03-16 MED ORDER — ACETAMINOPHEN 325 MG PO TABS: 650.0000 mg | ORAL_TABLET | ORAL | Status: DC | PRN

## 2015-03-16 NOTE — Anesthesia Postprocedure Evaluation (Signed)
Anesthesia Post Note  Patient: Diana Nunez  Procedure(s) Performed: * No procedures listed *  Anesthesia type: Epidural  Patient location: Mother/Baby  Post pain: Pain level controlled  Post assessment: Post-op Vital signs reviewed  Last Vitals:  Filed Vitals:   03/16/15 1250  BP: 125/56  Pulse: 84  Temp: 37.1 C  Resp: 18    Post vital signs: Reviewed  Level of consciousness:alert  Complications: No apparent anesthesia complications

## 2015-03-16 NOTE — Progress Notes (Signed)
I assisted ArtistMargaret RN from Lactation with some information and feeding upgrades, by [employer] PipelineViria Alvarez Spanish Interpreter

## 2015-03-16 NOTE — Progress Notes (Signed)
Diana Nunez is a 24 y.o. G2P1001 at 4734w0d by LMP admitted for active labor  Subjective:   Objective: BP 114/69 mmHg  Pulse 77  Temp(Src) 98.9 F (37.2 C) (Oral)  Resp 18  Ht 5\' 2"  (1.575 m)  Wt 183 lb (83.008 kg)  BMI 33.46 kg/m2  SpO2 100%  LMP 06/09/2014      FHT:  FHR: 140 bpm, variability: moderate,  accelerations:  Present,  decelerations:  Absent UC:   regular, every 2-4 minutes SVE:   Dilation: 6 Effacement (%): 100 Station: -1 Exam by:: ansah-mensah, rnc   Labs: Lab Results  Component Value Date   WBC 11.1* 03/15/2015   HGB 12.7 03/15/2015   HCT 36.9 03/15/2015   MCV 91.1 03/15/2015   PLT 178 03/15/2015    Assessment / Plan: Spontaneous labor, progressing normally  Labor: Progressing normally Preeclampsia:  n/a Fetal Wellbeing:  Category I Pain Control:  Epidural I/D:  n/a Anticipated MOD:  NSVD  [name] A 03/16/2015, 2:14 AM

## 2015-03-16 NOTE — Lactation Note (Addendum)
This note was copied from the chart of Girl Valarie Conesandelaria Garcia-Salazar. Lactation Consultation Note  Patient Name: [name] LKGMW'NToday's Date: 03/16/2015 Reason for consult: Initial assessment;Other (Comment) ([facility] Spanish interpreter Vitia , Onalee Hualvarez present )  Baby is 5212 hours old and has been to the breast several times and has been supplementing with formula  Per moms request. Per mom baby last fed at 3pm for 50 mins and supplement with 10 ml of formula after wards.  Presently baby is being held by family member, sleeping and mom is eating dinner. LC discussed supply and demand and recommended offering both breast at a feeding. If the baby seems satisfied  To hold off supplementing and allow the baby to get hungry and re-latch at the breast .  Mother informed of post-discharge support and given phone number to the lactation department, including services  for phone call assistance; out-patient appointments; and breastfeeding support group. List of other breastfeeding resources  in the community given in the handout. Encouraged mother to call for problems or concerns related to breastfeeding. Mom is aware to call for latch assist on nurses light.    Maternal Data    Feeding Feeding Type:  (updated doc flow sheets , per mom last fed at 3pm ) Length of feed: 50 min (per mom per spanish interpreter fed 25 mins each breast )  LATCH Score/Interventions                      Lactation Tools Discussed/Used WIC Program: Yes ([facility] also attended the BF class )   Consult Status Consult Status: Follow-up Date: 03/17/15 Follow-up type: In-patient    Kathrin Greathouseorio, Sherry Rogus Ann 03/16/2015, 6:07 PM

## 2015-03-16 NOTE — Progress Notes (Signed)
I stopped by to check on patient's needs and ordered her lunch.  Eda H Royal Interpreter. °

## 2015-03-16 NOTE — Progress Notes (Signed)
I stopped by patient's room to check on her needs, I ordered her dinner, snack and breakfast, by Orlan LeavensViria Alvarez Spanish Interpreter.

## 2015-03-16 NOTE — Progress Notes (Signed)
UR chart review completed.  

## 2015-03-17 ENCOUNTER — Ambulatory Visit: Payer: Self-pay

## 2015-03-17 ENCOUNTER — Inpatient Hospital Stay (HOSPITAL_COMMUNITY): Admission: RE | Admit: 2015-03-17 | Payer: Self-pay | Source: Ambulatory Visit

## 2015-03-17 LAB — CBC
HCT: 32.9 % — ABNORMAL LOW (ref 36.0–46.0)
Hemoglobin: 11.2 g/dL — ABNORMAL LOW (ref 12.0–15.0)
MCH: 31.7 pg (ref 26.0–34.0)
MCHC: 34 g/dL (ref 30.0–36.0)
MCV: 93.2 fL (ref 78.0–100.0)
Platelets: 129 10*3/uL — ABNORMAL LOW (ref 150–400)
RBC: 3.53 MIL/uL — ABNORMAL LOW (ref 3.87–5.11)
RDW: 13.7 % (ref 11.5–15.5)
WBC: 11 10*3/uL — AB (ref 4.0–10.5)

## 2015-03-17 NOTE — Discharge Instructions (Signed)
Discharge instructions   You can wash your hair  Shower  Eat what you want  Drink what you want  See me in 6 weeks  Your ankles are going to swell more in the next 2 weeks than when pregnant  No sex for 6 weeks   Lynanne Delgreco A, MD 03/17/2015

## 2015-03-17 NOTE — Lactation Note (Signed)
This note was copied from the chart of Diana Valarie Conesandelaria Nunez. Lactation Consultation Note  Patient Name: Diana Nunez ZOXWR'UToday's Date: 03/17/2015 Reason for consult: Follow-up assessment of this mom and baby, now 38 hours pp.  Baby receiving phototx and mom has decided now to pump and bottle-feed and is now obtaining more ebm, with recent feedings of 2 and 4 ml's and additional supplement with 10 ml's of formula.  Mom states that she is pumping q3h with DEBP which LC encouraged her to continue for maximum milk production.   Maternal Data Reason for exclusion: Mother's choice to formula and breast feed on admission  Feeding    LATCH Score/Interventions           LATCH score of "6" after midnight but no further LATCH scores;  Baby did breastfed at 0330 and 0500 and since then has received only bottle-feedings with ebm and/or formula           Lactation Tools Discussed/Used   DEBP q3h   Consult Status Consult Status: Follow-up Date: 03/18/15 Follow-up type: In-patient    Warrick ParisianBryant, Charmika Macdonnell [facility] 03/17/2015, 8:25 PM

## 2015-03-17 NOTE — Progress Notes (Signed)
I assisted Bethann Berkshirerisha, RN with explanation of care plan for the Baby and ordered patient's lunch.  Eda H Royal Interpreter.

## 2015-03-17 NOTE — Progress Notes (Signed)
I assisted Bethann Berkshirerisha, RN with teaching. Eda H Royal  Interpreter.

## 2015-03-17 NOTE — Discharge Summary (Signed)
Obstetric Discharge Summary Reason for Admission: onset of labor Prenatal Procedures: none Intrapartum Procedures: spontaneous vaginal delivery Postpartum Procedures: none Complications-Operative and Postpartum: none HEMOGLOBIN  Date Value Ref Range Status  03/15/2015 12.7 12.0 - 15.0 g/dL Final   HCT  Date Value Ref Range Status  03/15/2015 36.9 36.0 - 46.0 % Final    Physical Exam:  General: alert Lochia: appropriate Uterine Fundus: firm Incision: healing well DVT Evaluation: No evidence of DVT seen on physical exam.  Discharge Diagnoses: Term Pregnancy-delivered  Discharge Information: Date: 03/17/2015 Activity: pelvic rest Diet: routine Medications: Iron and Percocet Condition: improved Instructions: refer to practice specific booklet Discharge to: home Follow-up Information    Follow up with Kathreen CosierMARSHALL,Keagon Glascoe A, MD.   Specialty:  Obstetrics and Gynecology   Contact information:   [address] [place] [address] [phone]       Newborn Data: Live born female  Birth Weight: 7 lb 8.8 oz (3425 g) APGAR: 9, 9  Home with mother.  Ayeza Therriault A 03/17/2015, 6:37 AM

## 2015-03-17 NOTE — Progress Notes (Signed)
Patient ID: Diana ConesCandelaria Garcia-Salazar, female   DOB: 11/20/1991, 24 y.o.   MRN: [id number] Postpartum day one Vital signs normal Fundus firm Lochia moderate Wants early discharge

## 2015-03-18 ENCOUNTER — Ambulatory Visit: Payer: Self-pay

## 2015-03-18 NOTE — Lactation Note (Signed)
This note was copied from the chart of Diana Valarie Conesandelaria Nunez. Lactation Consultation Note: Mothers breast are filling. Assist mother with pumping. Mother has bilateral positional strips from shallow latching. Mother has an appt with WIC on April 6 . She has been pumping every 2-3 hours . Father states that infant fed at 8 am. 15ml of formula.  Advised parents to page for assistance with next feeding. Discussed the use of a nipple shield when latching infant. [facility]. Will follow up with Spanish interpreter with all teaching.  Patient Name: Diana Nunez NWGNF'AToday's Date: 03/18/2015 Reason for consult: Follow-up assessment   Maternal Data    Feeding Feeding Type: Bottle Fed - Formula Nipple Type: Slow - flow  LATCH Score/Interventions                      Lactation Tools Discussed/Used     Consult Status      Stevan BornKendrick, Yvonne Petite McCoy 03/18/2015, 10:08 AM

## 2016-10-21 LAB — GLUCOSE, POCT (MANUAL RESULT ENTRY): POC GLUCOSE: 101 mg/dL — AB (ref 70–99)

## 2017-10-24 ENCOUNTER — Encounter (HOSPITAL_COMMUNITY): Payer: Self-pay | Admitting: Emergency Medicine

## 2017-10-24 ENCOUNTER — Emergency Department (HOSPITAL_COMMUNITY)
Admission: EM | Admit: 2017-10-24 | Discharge: 2017-10-24 | Disposition: A | Discharge status: Home / Self Care | Payer: Self-pay | Attending: Emergency Medicine | Admitting: Emergency Medicine

## 2017-10-24 DIAGNOSIS — G2581 Restless legs syndrome: Secondary | ICD-10-CM | POA: Insufficient documentation

## 2017-10-24 DIAGNOSIS — G245 Blepharospasm: Secondary | ICD-10-CM | POA: Insufficient documentation

## 2017-10-24 MED ORDER — BACLOFEN 5 MG PO TABS: 5.0000 mg | ORAL_TABLET | Freq: Three times a day (TID) | ORAL | 0 refills | Status: DC

## 2017-10-24 NOTE — ED Notes (Signed)
See PA note for physical assessment  

## 2017-10-24 NOTE — ED Provider Notes (Signed)
[facility] Provider Note   CSN: [id number] Arrival date & time: 10/24/17  1810     History   Chief Complaint Chief Complaint  Patient presents with  . Eye Problem    HPI Diana Nunez is a 26 y.o. female.  Patient reports spasm of the lower right eyelid, intermittent over the last month. She denies visual disturbance, blurred vision, eye pain, eye drainage, eye irritation. Daily headache that is relieved with Excedrin. No extremity weakness. No nausea, vomiting. No fever. No neck pain. She endorses increased stress. She also reports involutary movement of legs at night when trying to sleep.   The history is provided by the patient. A language interpreter was used.  Eye Problem   This is a recurrent problem. The current episode started more than 1 week ago. The problem occurs daily. The problem has been gradually worsening. There is a problem in the right eye. There was no injury mechanism. The patient is experiencing no pain. There is no history of trauma to the eye. There is no known exposure to pink eye. Pertinent negatives include no numbness, no blurred vision, no decreased vision, no discharge, no double vision, no foreign body sensation, no photophobia, no eye redness, no nausea, no vomiting, no weakness and no itching.    Past Medical History:  Diagnosis Date  . Medical history non-contributory     Patient Active Problem List   Diagnosis Date Noted  . NSVD (normal spontaneous vaginal delivery) 03/16/2015  . Active labor 03/15/2015    Past Surgical History:  Procedure Laterality Date  . NO PAST SURGERIES      OB History    Gravida Para Term Preterm AB Living   2 2 2     2    SAB TAB Ectopic Multiple Live Births         0 2       Home Medications    Prior to Admission medications   Not on File    Family History No family history on file.  Social History Social History   Tobacco Use  . Smoking  status: Never Smoker  . Smokeless tobacco: Never Used  Substance Use Topics  . Alcohol use: No  . Drug use: No     Allergies   Patient has no known allergies.   Review of Systems Review of Systems  Eyes: Negative for blurred vision, double vision, photophobia, discharge and redness.  Gastrointestinal: Negative for nausea and vomiting.  Skin: Negative for itching.  Neurological: Positive for headaches. Negative for dizziness, facial asymmetry, speech difficulty, weakness and numbness.  All other systems reviewed and are negative.    Physical Exam Updated Vital Signs BP (!) 106/55 (BP Location: Right Arm)   Pulse 88   Temp 98.8 F (37.1 C) (Oral)   Resp 17   Ht 5\' 5"  (1.651 m)   Wt 70.3 kg (155 lb)   LMP 08/24/2017   SpO2 100%   BMI 25.79 kg/m   Physical Exam  Constitutional: She appears well-developed and well-nourished. No distress.  HENT:  Head: Normocephalic and atraumatic.  Eyes: Conjunctivae and EOM are normal. Pupils are equal, round, and reactive to light. Right eye exhibits no discharge. Left eye exhibits no discharge. No scleral icterus.  Neck: Normal range of motion. Neck supple.  Cardiovascular: Normal rate and regular rhythm.  No murmur heard. Pulmonary/Chest: Effort normal and breath sounds normal. No respiratory distress.  Abdominal: Soft. There is no tenderness.  Musculoskeletal: Normal  range of motion. She exhibits no edema.  Lymphadenopathy:    She has no cervical adenopathy.  Neurological: She is alert. She has normal strength. No cranial nerve deficit or sensory deficit.  Skin: Skin is warm and dry.  Psychiatric: She has a normal mood and affect.  Nursing note and vitals reviewed.    ED Treatments / Results  Labs (all labs ordered are listed, but only abnormal results are displayed) Labs Reviewed - No data to display  EKG  EKG Interpretation None       Radiology No results found.  Procedures Procedures (including critical care  time)  Medications Ordered in ED Medications - No data to display   Initial Impression / Assessment and Plan / ED Course  I have reviewed the triage vital signs and the nursing notes.  Pertinent labs & imaging results that were available during my care of the patient were reviewed by me and considered in my medical decision making (see chart for details).    Spanish interpreter utilized to obtain HPI and ROS. Patient with spasms of the muscles of the lower right eyelid. Pt is afebrile with no focal neuro deficits, nuchal rigidity, or change in vision. Pt is to follow up with [facility]. Care instructions for blepharospasm and restless legs provided. Return precautions discussed. Pt verbalizes understanding and is agreeable with plan to dc.   Final Clinical Impressions(s) / ED Diagnoses   Final diagnoses:  Blepharospasm  Restless legs    ED Discharge Orders        Ordered    baclofen 5 MG TABS  3 times daily     10/24/17 1913       Felicie Morn, NP 10/24/17 1946    Gwyneth Sprout, MD 10/24/17 2315

## 2017-10-24 NOTE — ED Triage Notes (Signed)
Husband/translator stated, she have problem with her right eye to close for about 4 weeks.

## 2017-10-24 NOTE — ED Notes (Signed)
PT states understanding of care given, follow up care. PT is ambulated from ED to car with a steady gait. 

## 2017-10-30 ENCOUNTER — Emergency Department (HOSPITAL_COMMUNITY)
Admission: EM | Admit: 2017-10-30 | Discharge: 2017-10-30 | Disposition: A | Discharge status: Home / Self Care | Payer: Self-pay | Attending: Emergency Medicine | Admitting: Emergency Medicine

## 2017-10-30 ENCOUNTER — Emergency Department (HOSPITAL_COMMUNITY): Payer: Self-pay

## 2017-10-30 ENCOUNTER — Encounter (HOSPITAL_COMMUNITY): Payer: Self-pay | Admitting: Emergency Medicine

## 2017-10-30 DIAGNOSIS — H5711 Ocular pain, right eye: Secondary | ICD-10-CM | POA: Insufficient documentation

## 2017-10-30 LAB — CBC WITH DIFFERENTIAL/PLATELET
BASOS ABS: 0 10*3/uL (ref 0.0–0.1)
Basophils Relative: 0 %
Eosinophils Absolute: 0.4 10*3/uL (ref 0.0–0.7)
Eosinophils Relative: 3 %
HEMATOCRIT: 42.3 % (ref 36.0–46.0)
Hemoglobin: 14.9 g/dL (ref 12.0–15.0)
LYMPHS ABS: 3 10*3/uL (ref 0.7–4.0)
Lymphocytes Relative: 23 %
MCH: 32 pg (ref 26.0–34.0)
MCHC: 35.2 g/dL (ref 30.0–36.0)
MCV: 90.8 fL (ref 78.0–100.0)
MONO ABS: 1.3 10*3/uL — AB (ref 0.1–1.0)
MONOS PCT: 10 %
NEUTROS ABS: 8.3 10*3/uL — AB (ref 1.7–7.7)
Neutrophils Relative %: 64 %
PLATELETS: 204 10*3/uL (ref 150–400)
RBC: 4.66 MIL/uL (ref 3.87–5.11)
RDW: 13.2 % (ref 11.5–15.5)
WBC: 13 10*3/uL — ABNORMAL HIGH (ref 4.0–10.5)

## 2017-10-30 LAB — BASIC METABOLIC PANEL
Anion gap: 7 (ref 5–15)
BUN: 10 mg/dL (ref 6–20)
CALCIUM: 9.5 mg/dL (ref 8.9–10.3)
CO2: 24 mmol/L (ref 22–32)
Chloride: 107 mmol/L (ref 101–111)
Creatinine, Ser: 0.68 mg/dL (ref 0.44–1.00)
GFR calc Af Amer: >60 mL/min (ref >60)
GLUCOSE: 99 mg/dL (ref 65–99)
Potassium: 4.7 mmol/L (ref 3.5–5.1)
SODIUM: 138 mmol/L (ref 135–145)

## 2017-10-30 LAB — TSH: TSH: 5.295 u[IU]/mL — ABNORMAL HIGH (ref 0.350–4.500)

## 2017-10-30 NOTE — Discharge Instructions (Signed)
Follow-up with ophthalmology if your symptoms are not improving in the next week.  Contact information for Dr. Genia DelMincey has been provided in this discharge summary for you to call and make these arrangements as needed.

## 2017-10-30 NOTE — ED Notes (Signed)
Pt departed in NAD, refused use of wheelchair.  

## 2017-10-30 NOTE — ED Provider Notes (Signed)
[facility]   CSN: [id number] Arrival date & time: 10/30/17  0000     History   Chief Complaint Chief Complaint  Patient presents with  . Eye Problem    HPI Diana Nunez is a 26 y.o. female.  Patient is a 26 year old female with no significant past medical history presenting with complaints of right eye pain, swelling, and twitching.  This is been ongoing for the past several days.  She was seen here and diagnosed with blepharospasm, and discharged home.  She returns today stating that her symptoms are worse and she now feels as though something is pushing on the back of her eye.  She denies any visual disturbances.  She denies any fevers or chills.   The history is provided by the patient.    Past Medical History:  Diagnosis Date  . Medical history non-contributory     Patient Active Problem List   Diagnosis Date Noted  . NSVD (normal spontaneous vaginal delivery) 03/16/2015  . Active labor 03/15/2015    Past Surgical History:  Procedure Laterality Date  . NO PAST SURGERIES      OB History    Gravida Para Term Preterm AB Living   2 2 2     2    SAB TAB Ectopic Multiple Live Births         0 2       Home Medications    Prior to Admission medications   Medication Sig Start Date End Date Taking? Authorizing Provider  baclofen 5 MG TABS Take 5 mg 3 (three) times daily by mouth. 10/24/17   Felicie MornSmith, David, NP    Family History No family history on file.  Social History Social History   Tobacco Use  . Smoking status: Never Smoker  . Smokeless tobacco: Never Used  Substance Use Topics  . Alcohol use: No  . Drug use: No     Allergies   Patient has no known allergies.   Review of Systems Review of Systems  All other systems reviewed and are negative.    Physical Exam Updated Vital Signs BP 130/74 (BP Location: Right Arm)   Pulse 74   Temp 98.4 F (36.9 C) (Oral)   Resp 16    SpO2 100%   Physical Exam  Constitutional: She is oriented to person, place, and time. She appears well-developed and well-nourished. No distress.  HENT:  Head: Normocephalic and atraumatic.  Eyes: Pupils are equal, round, and reactive to light.  The right eye appears grossly normal.  Pupils are equally round and reactive.  Extraocular muscles are intact.  There is no diplopia with upward or downward gaze.  Neck: Normal range of motion. Neck supple.  Pulmonary/Chest: Effort normal.  Neurological: She is alert and oriented to person, place, and time.  Skin: Skin is warm and dry. She is not diaphoretic.  Nursing note and vitals reviewed.    ED Treatments / Results  Labs (all labs ordered are listed, but only abnormal results are displayed) Labs Reviewed  BASIC METABOLIC PANEL  CBC WITH DIFFERENTIAL/PLATELET  TSH    EKG  EKG Interpretation None       Radiology No results found.  Procedures Procedures (including critical care time)  Medications Ordered in ED Medications - No data to display   Initial Impression / Assessment and Plan / ED Course  I have reviewed the triage vital signs and the nursing notes.  Pertinent labs & imaging results that were  available during my care of the patient were reviewed by me and considered in my medical decision making (see chart for details).  Patient presents with twitching and bulging of her right eye.  She is taken several pictures of herself where she feels as though her right eye is more protuberant than the left.  She is also complaining of "twitching" of her right lower eyelid.  I am unable to appreciate these on exam.  CT scan shows no evidence for retrobulbar abnormality or proptosis.  Her laboratory studies are unremarkable.  She will be discharged, to follow-up with her primary doctor if not improving.  Final Clinical Impressions(s) / ED Diagnoses   Final diagnoses:  None    ED Discharge Orders    None       Geoffery Lyonselo,  Asante Blanda, MD 10/30/17 [phone]

## 2017-10-30 NOTE — ED Triage Notes (Signed)
Patient reports right eye discomfort for 4 weeks , described as shaking/trembling , appears bigger than left , denies pain / no blurred vision , pt. Also requesting sleeping pill for her insomnia and anxiety. Interpreter service utilized at triage .

## 2017-10-30 NOTE — ED Notes (Signed)
Spoke to [place] in main lab and blood for pt not received.   Blood was tubed to main lab approximately 0245 from pod A tube station due to not having any tubes in mini lab.

## 2017-10-30 NOTE — ED Notes (Signed)
Patient transported to CT 

## 2017-10-30 NOTE — ED Notes (Signed)
supv Thomas from main lab in room and apologized to pt abt not receiving her blood.  I recollected blood....blood given to supv Thomas to take to main lab to process.

## 2018-02-09 ENCOUNTER — Other Ambulatory Visit: Payer: Self-pay

## 2018-02-09 ENCOUNTER — Emergency Department (HOSPITAL_COMMUNITY)
Admission: EM | Admit: 2018-02-09 | Discharge: 2018-02-10 | Disposition: A | Discharge status: Home / Self Care | Payer: No Typology Code available for payment source | Attending: Emergency Medicine | Admitting: Emergency Medicine

## 2018-02-09 ENCOUNTER — Emergency Department (HOSPITAL_COMMUNITY): Payer: No Typology Code available for payment source

## 2018-02-09 ENCOUNTER — Encounter (HOSPITAL_COMMUNITY): Payer: Self-pay

## 2018-02-09 DIAGNOSIS — R079 Chest pain, unspecified: Secondary | ICD-10-CM | POA: Insufficient documentation

## 2018-02-09 DIAGNOSIS — M542 Cervicalgia: Secondary | ICD-10-CM | POA: Insufficient documentation

## 2018-02-09 LAB — POC URINE PREG, ED: Preg Test, Ur: NEGATIVE

## 2018-02-09 MED ORDER — CYCLOBENZAPRINE HCL 10 MG PO TABS: 10.0000 mg | ORAL_TABLET | Freq: Two times a day (BID) | ORAL | 0 refills | Status: DC | PRN

## 2018-02-09 MED ORDER — IOPAMIDOL (ISOVUE-300) INJECTION 61%
INTRAVENOUS | Status: AC
  Administered 2018-02-09: 75 mL
  Filled 2018-02-09: qty 75

## 2018-02-09 MED ORDER — IBUPROFEN 600 MG PO TABS: 600.0000 mg | ORAL_TABLET | Freq: Four times a day (QID) | ORAL | 0 refills | Status: DC | PRN

## 2018-02-09 MED ORDER — MORPHINE SULFATE (PF) 4 MG/ML IV SOLN
4.0000 mg | Freq: Once | INTRAVENOUS | Status: AC
  Administered 2018-02-09: 4 mg via INTRAVENOUS
  Filled 2018-02-09: qty 1

## 2018-02-09 NOTE — ED Notes (Signed)
Pt unable to void at this time. 
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
## 2018-02-09 NOTE — ED Notes (Signed)
Taken to xray at this time. 

## 2019-06-06 ENCOUNTER — Encounter (HOSPITAL_COMMUNITY): Payer: Self-pay | Admitting: Emergency Medicine

## 2019-06-06 ENCOUNTER — Other Ambulatory Visit: Payer: Self-pay

## 2019-06-06 ENCOUNTER — Emergency Department (HOSPITAL_COMMUNITY)
Admission: EM | Admit: 2019-06-06 | Discharge: 2019-06-07 | Disposition: A | Discharge status: Home / Self Care | Payer: HRSA Program | Attending: Emergency Medicine | Admitting: Emergency Medicine

## 2019-06-06 ENCOUNTER — Emergency Department (HOSPITAL_COMMUNITY): Payer: HRSA Program

## 2019-06-06 DIAGNOSIS — Z79899 Other long term (current) drug therapy: Secondary | ICD-10-CM | POA: Insufficient documentation

## 2019-06-06 DIAGNOSIS — Z20822 Contact with and (suspected) exposure to covid-19: Secondary | ICD-10-CM

## 2019-06-06 DIAGNOSIS — R0789 Other chest pain: Secondary | ICD-10-CM | POA: Diagnosis not present

## 2019-06-06 DIAGNOSIS — Z20828 Contact with and (suspected) exposure to other viral communicable diseases: Secondary | ICD-10-CM

## 2019-06-06 LAB — CBC
HCT: 43.8 % (ref 36.0–46.0)
Hemoglobin: 14.4 g/dL (ref 12.0–15.0)
MCH: 30.8 pg (ref 26.0–34.0)
MCHC: 32.9 g/dL (ref 30.0–36.0)
MCV: 93.8 fL (ref 80.0–100.0)
Platelets: 343 10*3/uL (ref 150–400)
RBC: 4.67 MIL/uL (ref 3.87–5.11)
RDW: 12.2 % (ref 11.5–15.5)
WBC: 13.4 10*3/uL — ABNORMAL HIGH (ref 4.0–10.5)
nRBC: 0 % (ref 0.0–0.2)

## 2019-06-06 LAB — TROPONIN I: Troponin I: <0.03 ng/mL (ref <0.03)

## 2019-06-06 LAB — BASIC METABOLIC PANEL
Anion gap: 10 (ref 5–15)
BUN: 14 mg/dL (ref 6–20)
CO2: 24 mmol/L (ref 22–32)
Calcium: 9.5 mg/dL (ref 8.9–10.3)
Chloride: 105 mmol/L (ref 98–111)
Creatinine, Ser: 0.75 mg/dL (ref 0.44–1.00)
GFR calc Af Amer: >60 mL/min (ref >60)
GFR calc non Af Amer: >60 mL/min (ref >60)
Glucose, Bld: 100 mg/dL — ABNORMAL HIGH (ref 70–99)
Potassium: 3.9 mmol/L (ref 3.5–5.1)
Sodium: 139 mmol/L (ref 135–145)

## 2019-06-06 LAB — I-STAT BETA HCG BLOOD, ED (NOT ORDERABLE): I-stat hCG, quantitative: <5 m[IU]/mL (ref <5)

## 2019-06-06 MED ORDER — SODIUM CHLORIDE 0.9% FLUSH: 3.0000 mL | Freq: Once | INTRAVENOUS | Status: DC

## 2019-06-06 NOTE — Discharge Instructions (Addendum)
Person Under Monitoring Name: Diana Nunez  Location: [address] [place] [address]   Infection Prevention Recommendations for Individuals Confirmed to have, or Being Evaluated for, 2019 Novel Coronavirus (COVID-19) Infection Who Receive Care at Home  Individuals who are confirmed to have, or are being evaluated for, COVID-19 should follow the prevention steps below until a healthcare provider or local or state health department says they can return to normal activities.  Stay home except to get medical care You should restrict activities outside your home, except for getting medical care. Do not go to work, school, or public areas, and do not use public transportation or taxis.  Call ahead before visiting your doctor Before your medical appointment, call the healthcare provider and tell them that you have, or are being evaluated for, COVID-19 infection. This will help the healthcare providers office take steps to keep other people from getting infected. Ask your healthcare provider to call the local or state health department.  Monitor your symptoms Seek prompt medical attention if your illness is worsening (e.g., difficulty breathing). Before going to your medical appointment, call the healthcare provider and tell them that you have, or are being evaluated for, COVID-19 infection. Ask your healthcare provider to call the local or state health department.  Wear a facemask You should wear a facemask that covers your nose and mouth when you are in the same room with other people and when you visit a healthcare provider. People who live with or visit you should also wear a facemask while they are in the same room with you.  Separate yourself from other people in your home As much as possible, you should stay in a different room from other people in your home. Also, you should use a separate bathroom, if available.  Avoid sharing household items You should  not share dishes, drinking glasses, cups, eating utensils, towels, bedding, or other items with other people in your home. After using these items, you should wash them thoroughly with soap and water.  Cover your coughs and sneezes Cover your mouth and nose with a tissue when you cough or sneeze, or you can cough or sneeze into your sleeve. Throw used tissues in a lined trash can, and immediately wash your hands with soap and water for at least 20 seconds or use an alcohol-based hand rub.  Wash your [facility] your hands often and thoroughly with soap and water for at least 20 seconds. You can use an alcohol-based hand sanitizer if soap and water are not available and if your hands are not visibly dirty. Avoid touching your eyes, nose, and mouth with unwashed hands.   Prevention Steps for Caregivers and Household Members of Individuals Confirmed to have, or Being Evaluated for, COVID-19 Infection Being Cared for in the Home  If you live with, or provide care at home for, a person confirmed to have, or being evaluated for, COVID-19 infection please follow these guidelines to prevent infection:  Follow healthcare providers instructions Make sure that you understand and can help the patient follow any healthcare provider instructions for all care.  Provide for the patients basic needs You should help the patient with basic needs in the home and provide support for getting groceries, prescriptions, and other personal needs.  Monitor the patients symptoms If they are getting sicker, call his or her medical provider and tell them that the patient has, or is being evaluated for, COVID-19 infection. This will help the healthcare providers office take  steps to keep other people from getting infected. Ask the healthcare provider to call the local or state health department.  Limit the number of people who have contact with the patient If possible, have only one caregiver for the  patient. Other household members should stay in another home or place of residence. If this is not possible, they should stay in another room, or be separated from the patient as much as possible. Use a separate bathroom, if available. Restrict visitors who do not have an essential need to be in the home.  Keep older adults, very young children, and other sick people away from the patient Keep older adults, very young children, and those who have compromised immune systems or chronic health conditions away from the patient. This includes people with chronic heart, lung, or kidney conditions, diabetes, and cancer.  Ensure good ventilation Make sure that shared spaces in the home have good air flow, such as from an air conditioner or an opened window, weather permitting.  Wash your hands often Wash your hands often and thoroughly with soap and water for at least 20 seconds. You can use an alcohol based hand sanitizer if soap and water are not available and if your hands are not visibly dirty. Avoid touching your eyes, nose, and mouth with unwashed hands. Use disposable paper towels to dry your hands. If not available, use dedicated cloth towels and replace them when they become wet.  Wear a facemask and gloves Wear a disposable facemask at all times in the room and gloves when you touch or have contact with the patients blood, body fluids, and/or secretions or excretions, such as sweat, saliva, sputum, nasal mucus, vomit, urine, or feces.  Ensure the mask fits over your nose and mouth tightly, and do not touch it during use. Throw out disposable facemasks and gloves after using them. Do not reuse. Wash your hands immediately after removing your facemask and gloves. If your personal clothing becomes contaminated, carefully remove clothing and launder. Wash your hands after handling contaminated clothing. Place all used disposable facemasks, gloves, and other waste in a lined container before  disposing them with other household waste. Remove gloves and wash your hands immediately after handling these items.  Do not share dishes, glasses, or other household items with the patient Avoid sharing household items. You should not share dishes, drinking glasses, cups, eating utensils, towels, bedding, or other items with a patient who is confirmed to have, or being evaluated for, COVID-19 infection. After the person uses these items, you should wash them thoroughly with soap and water.  Wash laundry thoroughly Immediately remove and wash clothes or bedding that have blood, body fluids, and/or secretions or excretions, such as sweat, saliva, sputum, nasal mucus, vomit, urine, or feces, on them. Wear gloves when handling laundry from the patient. Read and follow directions on labels of laundry or clothing items and detergent. In general, wash and dry with the warmest temperatures recommended on the label.  Clean all areas the individual has used often Clean all touchable surfaces, such as counters, tabletops, doorknobs, bathroom fixtures, toilets, phones, keyboards, tablets, and bedside tables, every day. Also, clean any surfaces that may have blood, body fluids, and/or secretions or excretions on them. Wear gloves when cleaning surfaces the patient has come in contact with. Use a diluted bleach solution (e.g., dilute bleach with 1 part bleach and 10 parts water) or a household disinfectant with a label that says EPA-registered for coronaviruses. To make a bleach solution  at home, add 1 tablespoon of bleach to 1 quart (4 cups) of water. For a larger supply, add  cup of bleach to 1 gallon (16 cups) of water. Read labels of cleaning products and follow recommendations provided on product labels. Labels contain instructions for safe and effective use of the cleaning product including precautions you should take when applying the product, such as wearing gloves or eye protection and making sure you  have good ventilation during use of the product. Remove gloves and wash hands immediately after cleaning.  Monitor yourself for signs and symptoms of illness Caregivers and household members are considered close contacts, should monitor their health, and will be asked to limit movement outside of the home to the extent possible. Follow the monitoring steps for close contacts listed on the symptom monitoring form.   ? If you have additional questions, contact your local health department or call the epidemiologist on call at [phone] (available 24/7). ? This guidance is subject to change. For the most up-to-date guidance from [facility], please refer to their website: YouBlogs.pl

## 2019-06-06 NOTE — ED Triage Notes (Signed)
Patient here from home with complaints of chest pain and SOB that started Sunday increased today. Denies n/v. Reports that she has been around dad with COVID.

## 2019-06-06 NOTE — ED Provider Notes (Signed)
[facility] DEPT Provider Note   CSN: [id number] Arrival date & time: 06/06/19  2043     History   Chief Complaint Chief Complaint  Patient presents with  . Chest Pain  . Shortness of Breath    HPI Diana Nunez is a 28 y.o. female.     28 yo F with a chief complaint of chest pain.  Has been going on for the past 2 or 3 days.  Worse when she tries to take a deep breath or when she goes for a walk.  Also worse with wearing a mask.  Denies cough congestion or fever.  Her father has been diagnosed with the novel coronavirus.  She denies abdominal pain denies vomiting denies diaphoresis.  Denies hypertension hyperlipidemia diabetes or smoking history.  No family history of MI.  She denies history of PE or DVT denies unilateral lower extremity edema denies hemoptysis denies recent immobilization travel surgery or estrogen use.  The history is provided by the patient.  Chest Pain Pain location:  Substernal area Pain quality: aching and pressure   Pain radiates to:  Does not radiate Pain severity:  Moderate Onset quality:  Gradual Duration:  2 days Timing:  Constant Progression:  Worsening Chronicity:  New Relieved by:  Nothing Worsened by:  Nothing Ineffective treatments:  None tried Associated symptoms: shortness of breath   Associated symptoms: no dizziness, no fever, no headache, no nausea, no palpitations and no vomiting   Shortness of Breath Associated symptoms: chest pain   Associated symptoms: no fever, no headaches, no vomiting and no wheezing     Past Medical History:  Diagnosis Date  . Medical history non-contributory     Patient Active Problem List   Diagnosis Date Noted  . NSVD (normal spontaneous vaginal delivery) 03/16/2015  . Active labor 03/15/2015    Past Surgical History:  Procedure Laterality Date  . NO PAST SURGERIES       OB History    Gravida  2   Para  2   Term  2   Preterm      AB      Living  2     SAB      TAB      Ectopic      Multiple  0   Live Births  2            Home Medications    Prior to Admission medications   Medication Sig Start Date End Date Taking? Authorizing Provider  aspirin 325 MG tablet Take 650 mg by mouth 2 (two) times daily as needed for headache.   Yes [provider]  hydrOXYzine (ATARAX/VISTARIL) 50 MG tablet Take 100 mg by mouth every 6 (six) hours as needed for anxiety.  05/21/19  Yes [provider]  levonorgestrel (MIRENA) 20 MCG/24HR IUD 1 each by Intrauterine route once.   Yes [provider]  baclofen 5 MG TABS Take 5 mg 3 (three) times daily by mouth. Patient not taking: Reported on 06/06/2019 10/24/17   Etta Quill, NP  cyclobenzaprine (FLEXERIL) 10 MG tablet Take 1 tablet (10 mg total) by mouth 2 (two) times daily as needed for muscle spasms. Patient not taking: Reported on 06/06/2019 02/09/18   Domenic Moras, PA-C  ibuprofen (ADVIL,MOTRIN) 600 MG tablet Take 1 tablet (600 mg total) by mouth every 6 (six) hours as needed. Patient not taking: Reported on 06/06/2019 02/09/18   Domenic Moras, PA-C    Family History No family  history on file.  Social History Social History   Tobacco Use  . Smoking status: Never Smoker  . Smokeless tobacco: Never Used  Substance Use Topics  . Alcohol use: No  . Drug use: No     Allergies   Patient has no known allergies.   Review of Systems Review of Systems  Constitutional: Negative for chills and fever.  HENT: Negative for congestion and rhinorrhea.   Eyes: Negative for redness and visual disturbance.  Respiratory: Positive for shortness of breath. Negative for wheezing.   Cardiovascular: Positive for chest pain. Negative for palpitations.  Gastrointestinal: Negative for nausea and vomiting.  Genitourinary: Negative for dysuria and urgency.  Musculoskeletal: Negative for arthralgias and myalgias.  Skin: Negative for pallor and wound.  Neurological:  Negative for dizziness and headaches.     Physical Exam Updated Vital Signs BP 126/83   Pulse 90   Temp 98.3 F (36.8 C) (Oral)   Resp 15   Ht 5\' 4"  (1.626 m)   Wt 76.2 kg   SpO2 100%   BMI 28.84 kg/m   Physical Exam Vitals signs and nursing note reviewed.  Constitutional:      General: She is not in acute distress.    Appearance: She is well-developed. She is not diaphoretic.  HENT:     Head: Normocephalic and atraumatic.  Eyes:     Pupils: Pupils are equal, round, and reactive to light.  Neck:     Musculoskeletal: Normal range of motion and neck supple.  Cardiovascular:     Rate and Rhythm: Normal rate and regular rhythm.     Heart sounds: No murmur. No friction rub. No gallop.   Pulmonary:     Effort: Pulmonary effort is normal.     Breath sounds: No wheezing or rales.  Chest:     Chest wall: Tenderness present.     Comments: Very mild tenderness with palpation to the anterior chest wall. Abdominal:     General: There is no distension.     Palpations: Abdomen is soft.     Tenderness: There is no abdominal tenderness.  Musculoskeletal:        General: No tenderness.  Skin:    General: Skin is warm and dry.  Neurological:     Mental Status: She is alert and oriented to person, place, and time.  Psychiatric:        Behavior: Behavior normal.      ED Treatments / Results  Labs (all labs ordered are listed, but only abnormal results are displayed) Labs Reviewed  BASIC METABOLIC PANEL - Abnormal; Notable for the following components:      Result Value   Glucose, Bld 100 (*)    All other components within normal limits  CBC - Abnormal; Notable for the following components:   WBC 13.4 (*)    All other components within normal limits  NOVEL CORONAVIRUS, NAA (HOSPITAL ORDER, SEND-OUT TO REF LAB)  TROPONIN I  I-STAT BETA HCG BLOOD, ED ([facility], [facility], [facility] ONLY)  I-STAT BETA HCG BLOOD, ED (NOT ORDERABLE)    EKG EKG Interpretation  Date/Time:  Thursday June 06 2019 21:26:03 EDT Ventricular Rate:  95 PR Interval:    QRS Duration: 86 QT Interval:  358 QTC Calculation: 450 R Axis:   83 Text Interpretation:  Sinus rhythm Borderline T wave abnormalities No old tracing to compare Confirmed by Melene PlanFloyd, Haim Hansson [phone]([id number]) on 06/06/2019 11:15:03 PM   Radiology Dg Chest 2 View  Result Date: 06/06/2019 CLINICAL  DATA:  Chest pain and shortness of breath. EXAM: CHEST - 2 VIEW COMPARISON:  None. FINDINGS: Lung volumes are low.The cardiomediastinal contours are normal. Minimal streaky bilateral infrahilar opacities. Pulmonary vasculature is normal. No confluent consolidation, pleural effusion, or pneumothorax. No acute osseous abnormalities are seen. IMPRESSION: Hypoventilatory chest with minimal streaky bilateral infrahilar opacities, likely atelectasis. Electronically Signed   By: Narda RutherfordMelanie  Sanford M.D.   On: 06/06/2019 21:49    Procedures Procedures (including critical care time)  Medications Ordered in ED Medications  sodium chloride flush (NS) 0.9 % injection 3 mL (has no administration in time range)     Initial Impression / Assessment and Plan / ED Course  I have reviewed the triage vital signs and the nursing notes.  Pertinent labs & imaging results that were available during my care of the patient were reviewed by me and considered in my medical decision making (see chart for details).        28 yo F with a chief complaint of chest pain.  This is atypical in nature.  Very unlikely to be ACS.  Patient had a troponin ordered in triage that was negative.  EKG with no concerning finding.  Chest x-ray viewed by me without focal infiltrate or pneumothorax.  Most concerning is the patient's recent exposure to someone with the coronavirus.  This could be the way that she is presenting with this.  She is not hypoxic.  We will have her self isolate at home.  Send an outpatient test.  PCP follow-up.  11:58 PM:  I have discussed the diagnosis/risks/treatment  options with the patient and believe the pt to be eligible for discharge home to follow-up with PCP. We also discussed returning to the ED immediately if new or worsening sx occur. We discussed the sx which are most concerning (e.g., sudden worsening pain, fever, inability to tolerate by mouth) that necessitate immediate return. Medications administered to the patient during their visit and any new prescriptions provided to the patient are listed below.  Medications given during this visit Medications  sodium chloride flush (NS) 0.9 % injection 3 mL (has no administration in time range)     The patient appears reasonably screen and/or stabilized for discharge and I doubt any other medical condition or other [facility] requiring further screening, evaluation, or treatment in the ED at this time prior to discharge.    Final Clinical Impressions(s) / ED Diagnoses   Final diagnoses:  Chest wall pain  Close Exposure to Covid-19 Virus    ED Discharge Orders    None       Melene PlanFloyd, Subhan Hoopes, DO 06/06/19 2358

## 2019-06-06 NOTE — ED Notes (Signed)
Spanish interpretor used, [employer] [phone]

## 2019-06-08 LAB — NOVEL CORONAVIRUS, NAA (HOSP ORDER, SEND-OUT TO REF LAB; TAT 18-24 HRS): SARS-CoV-2, NAA: NOT DETECTED

## 2019-11-28 ENCOUNTER — Other Ambulatory Visit: Payer: Self-pay

## 2019-11-28 DIAGNOSIS — Z20822 Contact with and (suspected) exposure to covid-19: Secondary | ICD-10-CM

## 2019-11-30 LAB — NOVEL CORONAVIRUS, NAA: SARS-CoV-2, NAA: DETECTED — AB

## 2020-05-15 ENCOUNTER — Emergency Department (HOSPITAL_COMMUNITY)
Admission: EM | Admit: 2020-05-15 | Discharge: 2020-05-15 | Disposition: A | Discharge status: Home / Self Care | Payer: Self-pay | Attending: Emergency Medicine | Admitting: Emergency Medicine

## 2020-05-15 ENCOUNTER — Other Ambulatory Visit: Payer: Self-pay

## 2020-05-15 ENCOUNTER — Encounter (HOSPITAL_COMMUNITY): Payer: Self-pay | Admitting: Emergency Medicine

## 2020-05-15 DIAGNOSIS — R82998 Other abnormal findings in urine: Secondary | ICD-10-CM | POA: Insufficient documentation

## 2020-05-15 DIAGNOSIS — N760 Acute vaginitis: Secondary | ICD-10-CM | POA: Insufficient documentation

## 2020-05-15 DIAGNOSIS — N390 Urinary tract infection, site not specified: Secondary | ICD-10-CM | POA: Insufficient documentation

## 2020-05-15 DIAGNOSIS — B9689 Other specified bacterial agents as the cause of diseases classified elsewhere: Secondary | ICD-10-CM | POA: Insufficient documentation

## 2020-05-15 DIAGNOSIS — R1030 Lower abdominal pain, unspecified: Secondary | ICD-10-CM

## 2020-05-15 LAB — COMPREHENSIVE METABOLIC PANEL
ALT: 31 U/L (ref 0–44)
AST: 20 U/L (ref 15–41)
Albumin: 3.9 g/dL (ref 3.5–5.0)
Alkaline Phosphatase: 54 U/L (ref 38–126)
Anion gap: 7 (ref 5–15)
BUN: 9 mg/dL (ref 6–20)
CO2: 25 mmol/L (ref 22–32)
Calcium: 8.7 mg/dL — ABNORMAL LOW (ref 8.9–10.3)
Chloride: 107 mmol/L (ref 98–111)
Creatinine, Ser: 0.65 mg/dL (ref 0.44–1.00)
GFR calc Af Amer: >60 mL/min (ref >60)
GFR calc non Af Amer: >60 mL/min (ref >60)
Glucose, Bld: 126 mg/dL — ABNORMAL HIGH (ref 70–99)
Potassium: 3.4 mmol/L — ABNORMAL LOW (ref 3.5–5.1)
Sodium: 139 mmol/L (ref 135–145)
Total Bilirubin: 0.3 mg/dL (ref 0.3–1.2)
Total Protein: 7.4 g/dL (ref 6.5–8.1)

## 2020-05-15 LAB — CBC
HCT: 42.9 % (ref 36.0–46.0)
Hemoglobin: 14.5 g/dL (ref 12.0–15.0)
MCH: 31.8 pg (ref 26.0–34.0)
MCHC: 33.8 g/dL (ref 30.0–36.0)
MCV: 94.1 fL (ref 80.0–100.0)
Platelets: 264 10*3/uL (ref 150–400)
RBC: 4.56 MIL/uL (ref 3.87–5.11)
RDW: 12.6 % (ref 11.5–15.5)
WBC: 8.5 10*3/uL (ref 4.0–10.5)
nRBC: 0 % (ref 0.0–0.2)

## 2020-05-15 LAB — URINALYSIS, ROUTINE W REFLEX MICROSCOPIC
Bilirubin Urine: NEGATIVE
Glucose, UA: NEGATIVE mg/dL
Hgb urine dipstick: NEGATIVE
Ketones, ur: NEGATIVE mg/dL
Nitrite: NEGATIVE
Protein, ur: NEGATIVE mg/dL
Specific Gravity, Urine: 1.004 — ABNORMAL LOW (ref 1.005–1.030)
pH: 7 (ref 5.0–8.0)

## 2020-05-15 LAB — I-STAT BETA HCG BLOOD, ED (MC, WL, AP ONLY): I-stat hCG, quantitative: <5 m[IU]/mL (ref <5)

## 2020-05-15 LAB — LIPASE, BLOOD: Lipase: 28 U/L (ref 11–51)

## 2020-05-15 LAB — WET PREP, GENITAL
Sperm: NONE SEEN
Trich, Wet Prep: NONE SEEN
Yeast Wet Prep HPF POC: NONE SEEN

## 2020-05-15 MED ORDER — METRONIDAZOLE 500 MG PO TABS: 500.0000 mg | ORAL_TABLET | Freq: Two times a day (BID) | ORAL | 0 refills | Status: DC

## 2020-05-15 MED ORDER — SODIUM CHLORIDE 0.9% FLUSH: 3.0000 mL | Freq: Once | INTRAVENOUS | Status: DC

## 2020-05-15 MED ORDER — CEPHALEXIN 500 MG PO CAPS: 500.0000 mg | ORAL_CAPSULE | Freq: Two times a day (BID) | ORAL | 0 refills | Status: AC

## 2020-05-15 NOTE — ED Triage Notes (Signed)
Pt c/o abd pains since November but was worse yesterday. Denies n/v/d or urination.pain was really bad yesterday not as bad today

## 2020-05-15 NOTE — ED Provider Notes (Signed)
[facility] DEPT Provider Note   CSN: [id number] Arrival date & time: 05/15/20  1056     History Chief Complaint  Patient presents with  . Abdominal Pain    Diana Nunez is a 29 y.o. female who presents to the ED today with complaint of gradual onset, intermittent, sharp, lower abdominal pain since November 2020. Pt also reports foul smelling urine for the past 8 months. She reports she was recently seen by her OBGYN 2 weeks ago and had a pap smear as well as was tested for STIs without any findings. She reports that she has had the mirena IUD in place since 2016 and her OBGYN is concerned it could be the cause of her pain. She is currently being started on OCPs with plan to remove IUD in 2 weeks. Pt reports the pain returned 2 days ago causing her concern and prompting her to come to the ED for further evaluation. Pt states she will sometimes get chills with this pain however denies fevers. She is sexually active with 1 female partner in a monogamous relationship and is not concerned about STIs. Pt denies nausea, vomiting, diarrhea, constipation, dysuria, urinary frequency, vaginal discharge, vaginal bleeding, or any other associated symptoms.   The history is provided by the patient and medical records. The history is limited by a language barrier. A language interpreter was used.       Past Medical History:  Diagnosis Date  . Medical history non-contributory     Patient Active Problem List   Diagnosis Date Noted  . NSVD (normal spontaneous vaginal delivery) 03/16/2015  . Active labor 03/15/2015    Past Surgical History:  Procedure Laterality Date  . NO PAST SURGERIES       OB History    Gravida  2   Para  2   Term  2   Preterm      AB      Living  2     SAB      TAB      Ectopic      Multiple  0   Live Births  2           No family history on file.  Social History   Tobacco Use  . Smoking status: Never  Smoker  . Smokeless tobacco: Never Used  Substance Use Topics  . Alcohol use: No  . Drug use: No    Home Medications Prior to Admission medications   Medication Sig Start Date End Date Taking? Authorizing Provider  acetaminophen (TYLENOL) 500 MG tablet Take 500 mg by mouth every 6 (six) hours as needed for moderate pain.   Yes [provider]  aspirin 325 MG tablet Take 325 mg by mouth daily as needed for moderate pain.   Yes [provider]  levonorgestrel (MIRENA) 20 MCG/24HR IUD 1 each by Intrauterine route once.   Yes [provider]  baclofen 5 MG TABS Take 5 mg 3 (three) times daily by mouth. Patient not taking: Reported on 06/06/2019 10/24/17   Etta Quill, NP  cephALEXin (KEFLEX) 500 MG capsule Take 1 capsule (500 mg total) by mouth 2 (two) times daily for 5 days. 05/15/20 05/20/20  Eustaquio Maize, PA-C  cyclobenzaprine (FLEXERIL) 10 MG tablet Take 1 tablet (10 mg total) by mouth 2 (two) times daily as needed for muscle spasms. Patient not taking: Reported on 06/06/2019 02/09/18   Domenic Moras, PA-C  ibuprofen (ADVIL,MOTRIN) 600 MG tablet Take 1 tablet (600  mg total) by mouth every 6 (six) hours as needed. Patient not taking: Reported on 06/06/2019 02/09/18   Fayrene Helper, PA-C  metroNIDAZOLE (FLAGYL) 500 MG tablet Take 1 tablet (500 mg total) by mouth 2 (two) times daily. 05/15/20   Tanda Rockers, PA-C    Allergies    Patient has no known allergies.  Review of Systems   Review of Systems  Constitutional: Positive for chills. Negative for fever.  Gastrointestinal: Positive for abdominal pain. Negative for constipation, diarrhea, nausea and vomiting.  Genitourinary: Positive for pelvic pain. Negative for dysuria, frequency, vaginal bleeding and vaginal discharge.       + foul smelling urine  All other systems reviewed and are negative.   Physical Exam Updated Vital Signs BP (!) 114/48 (BP Location: Right Arm)   Pulse 72   Temp 98.7 F (37.1 C)   Resp  16   Wt 81.6 kg   SpO2 100%   BMI 30.88 kg/m   Physical Exam Vitals and nursing note reviewed.  Constitutional:      Appearance: She is not ill-appearing or diaphoretic.  HENT:     Head: Normocephalic and atraumatic.  Eyes:     Conjunctiva/sclera: Conjunctivae normal.  Cardiovascular:     Rate and Rhythm: Normal rate and regular rhythm.     Heart sounds: Normal heart sounds.  Pulmonary:     Effort: Pulmonary effort is normal.     Breath sounds: Normal breath sounds. No wheezing, rhonchi or rales.  Abdominal:     General: Bowel sounds are normal.     Palpations: Abdomen is soft.     Tenderness: There is no abdominal tenderness. There is no right CVA tenderness, left CVA tenderness, guarding or rebound.  Genitourinary:    Comments: Chaperone present for exam Valerie Salts, RN. No rashes, lesions, or tenderness to external genitalia. No erythema, injury, or tenderness to vaginal mucosa. Cervical friability noted with small amount of blood. No discharge or blood noted in vault. No adnexla TTP or CMT or discharge from cervical os. Cervical os is closed. Uterus non-deviated, mobile, nonTTP, and without enlargement.  Musculoskeletal:     Cervical back: Neck supple.  Skin:    General: Skin is warm and dry.  Neurological:     Mental Status: She is alert.     ED Results / Procedures / Treatments   Labs (all labs ordered are listed, but only abnormal results are displayed) Labs Reviewed  WET PREP, GENITAL - Abnormal; Notable for the following components:      Result Value   Clue Cells Wet Prep HPF POC PRESENT (*)    WBC, Wet Prep HPF POC MANY (*)    All other components within normal limits  COMPREHENSIVE METABOLIC PANEL - Abnormal; Notable for the following components:   Potassium 3.4 (*)    Glucose, Bld 126 (*)    Calcium 8.7 (*)    All other components within normal limits  URINALYSIS, ROUTINE W REFLEX MICROSCOPIC - Abnormal; Notable for the following components:    APPearance HAZY (*)    Specific Gravity, Urine 1.004 (*)    Leukocytes,Ua MODERATE (*)    Bacteria, UA RARE (*)    All other components within normal limits  LIPASE, BLOOD  CBC  I-STAT BETA HCG BLOOD, ED ([facility], [facility], [facility] ONLY)    EKG None  Radiology No results found.  Procedures Procedures (including critical care time)  Medications Ordered in ED Medications  sodium chloride flush (NS) 0.9 % injection  3 mL (has no administration in time range)    ED Course  I have reviewed the triage vital signs and the nursing notes.  Pertinent labs & imaging results that were available during my care of the patient were reviewed by me and considered in my medical decision making (see chart for details).  Clinical Course as of May 15 2154  Fri May 15, 2020  1701 I-stat hCG, quantitative: <5.0 [MV]  1742 Clue Cells Wet Prep HPF POC(!): PRESENT [MV]    Clinical Course User Index [MV] Tanda Rockers, PA-C   MDM Rules/Calculators/A&P                      29 year old female presents the ED today complaining of intermittent lower abdominal/pelvic pain for the past 8 months.  He also reports foul-smelling urine.  Has been seen by OB/GYN with plan for removal of IUD as a believe this is the cause of patient's pain.  Reports pain became worse 2 days ago prompting her to come to the ED for further evaluation.  Arrival to the ED patient is afebrile, nontachycardic and nontachypneic.  She appears to be in no acute distress.  Does not have any specific lower abdominal tenderness palpation on exam however will perform pelvic exam as it does sound more like pelvic pain.  Does indicate that she was recently tested for STIs 2 weeks ago does not want to be tested again.  He is unsure if she was tested for yeast or bacterial vaginosis, will perform wet prep.  Pelvic exam performed, no obvious adnexal or CMT tenderness palpation however patient is noted to have some cervical friability.   Labwork reassuring.    CBC without leukocytosis. Hgb stable.  CMP with potassium 3.4, will have pt increase potassium in her diet. No other electrolyte abnormalities.  Lipase 28 Beta hcg negative  Wet prep positive for clue cells, will treat U/A with moderate leuks and 11-20 WBCs with rare bacteria. Given pt's complaint of foul smelling urine will treat for UTI as well.   Pt encouraged to follow up with OBGYN in 2 weeks for IUD removal. She has already been switched to OCPs. She is discharged with keflex and flagyl. Pt instructed to return for any worsening symptoms. She is in agreement with plan and stable for discharge home.   This note was prepared using Dragon voice recognition software and may include unintentional dictation errors due to the inherent limitations of voice recognition software.  Final Clinical Impression(s) / ED Diagnoses Final diagnoses:  Lower urinary tract infectious disease  Bacterial vaginosis  Lower abdominal pain    Rx / DC Orders ED Discharge Orders         Ordered    cephALEXin (KEFLEX) 500 MG capsule  2 times daily     05/15/20 2151    metroNIDAZOLE (FLAGYL) 500 MG tablet  2 times daily     05/15/20 2151           Discharge Instructions     Please pick up medications and take as prescribed. DO NOT DRINK ALCOHOL WHILE ON THIS MEDICATION.   Keep appointment with your OBGYN as scheduled for 2 weeks to discuss IUD removal.   Return to the ED for any worsening symptoms.        Tanda Rockers, PA-C 05/15/20 2155    Gerhard Munch, MD 05/15/20 [phone]

## 2020-05-15 NOTE — ED Notes (Signed)
Pt ambulatory to bathroom no assistance needed.

## 2020-05-15 NOTE — Discharge Instructions (Signed)
Please pick up medications and take as prescribed. DO NOT DRINK ALCOHOL WHILE ON THIS MEDICATION.   Keep appointment with your OBGYN as scheduled for 2 weeks to discuss IUD removal.   Return to the ED for any worsening symptoms.

## 2020-08-22 ENCOUNTER — Emergency Department (HOSPITAL_COMMUNITY): Payer: Self-pay

## 2020-08-22 ENCOUNTER — Emergency Department (HOSPITAL_COMMUNITY)
Admission: EM | Admit: 2020-08-22 | Discharge: 2020-08-22 | Disposition: A | Discharge status: Home / Self Care | Payer: Self-pay | Attending: Emergency Medicine | Admitting: Emergency Medicine

## 2020-08-22 ENCOUNTER — Other Ambulatory Visit: Payer: Self-pay

## 2020-08-22 ENCOUNTER — Encounter (HOSPITAL_COMMUNITY): Payer: Self-pay

## 2020-08-22 DIAGNOSIS — R1032 Left lower quadrant pain: Secondary | ICD-10-CM | POA: Insufficient documentation

## 2020-08-22 DIAGNOSIS — R102 Pelvic and perineal pain: Secondary | ICD-10-CM

## 2020-08-22 DIAGNOSIS — Z7982 Long term (current) use of aspirin: Secondary | ICD-10-CM | POA: Insufficient documentation

## 2020-08-22 DIAGNOSIS — Z79899 Other long term (current) drug therapy: Secondary | ICD-10-CM | POA: Insufficient documentation

## 2020-08-22 DIAGNOSIS — N83201 Unspecified ovarian cyst, right side: Secondary | ICD-10-CM

## 2020-08-22 LAB — URINALYSIS, ROUTINE W REFLEX MICROSCOPIC
Bacteria, UA: NONE SEEN
Bilirubin Urine: NEGATIVE
Glucose, UA: NEGATIVE mg/dL
Hgb urine dipstick: NEGATIVE
Ketones, ur: NEGATIVE mg/dL
Nitrite: NEGATIVE
Protein, ur: NEGATIVE mg/dL
Specific Gravity, Urine: 1.021 (ref 1.005–1.030)
pH: 6 (ref 5.0–8.0)

## 2020-08-22 LAB — COMPREHENSIVE METABOLIC PANEL
ALT: 31 U/L (ref 0–44)
AST: 24 U/L (ref 15–41)
Albumin: 4 g/dL (ref 3.5–5.0)
Alkaline Phosphatase: 64 U/L (ref 38–126)
Anion gap: 11 (ref 5–15)
BUN: 11 mg/dL (ref 6–20)
CO2: 23 mmol/L (ref 22–32)
Calcium: 9.4 mg/dL (ref 8.9–10.3)
Chloride: 106 mmol/L (ref 98–111)
Creatinine, Ser: 0.62 mg/dL (ref 0.44–1.00)
GFR calc Af Amer: >60 mL/min (ref >60)
GFR calc non Af Amer: >60 mL/min (ref >60)
Glucose, Bld: 101 mg/dL — ABNORMAL HIGH (ref 70–99)
Potassium: 3.7 mmol/L (ref 3.5–5.1)
Sodium: 140 mmol/L (ref 135–145)
Total Bilirubin: 0.6 mg/dL (ref 0.3–1.2)
Total Protein: 7.6 g/dL (ref 6.5–8.1)

## 2020-08-22 LAB — CBC
HCT: 44.5 % (ref 36.0–46.0)
Hemoglobin: 14.9 g/dL (ref 12.0–15.0)
MCH: 31.3 pg (ref 26.0–34.0)
MCHC: 33.5 g/dL (ref 30.0–36.0)
MCV: 93.5 fL (ref 80.0–100.0)
Platelets: 291 10*3/uL (ref 150–400)
RBC: 4.76 MIL/uL (ref 3.87–5.11)
RDW: 12.5 % (ref 11.5–15.5)
WBC: 11.2 10*3/uL — ABNORMAL HIGH (ref 4.0–10.5)
nRBC: 0 % (ref 0.0–0.2)

## 2020-08-22 LAB — I-STAT BETA HCG BLOOD, ED (MC, WL, AP ONLY): I-stat hCG, quantitative: <5 m[IU]/mL (ref <5)

## 2020-08-22 LAB — LIPASE, BLOOD: Lipase: 26 U/L (ref 11–51)

## 2020-08-22 MED ORDER — ACETAMINOPHEN ER 650 MG PO TBCR: 650.0000 mg | EXTENDED_RELEASE_TABLET | Freq: Three times a day (TID) | ORAL | 0 refills | Status: DC | PRN

## 2020-08-22 MED ORDER — ACETAMINOPHEN 500 MG PO TABS: 500.0000 mg | ORAL_TABLET | Freq: Four times a day (QID) | ORAL | 0 refills | Status: DC | PRN

## 2020-08-22 MED ORDER — NAPROXEN 500 MG PO TABS
500.0000 mg | ORAL_TABLET | Freq: Two times a day (BID) | ORAL | 0 refills | Status: DC
Start: 2020-08-22 — End: 2021-01-26

## 2020-08-22 NOTE — ED Notes (Signed)
U/S present in room

## 2020-08-22 NOTE — ED Provider Notes (Signed)
[facility]-EMERGENCY DEPT Provider Note   CSN: [id number] Arrival date & time: 08/22/20  1900     History Chief Complaint  Patient presents with  . Abdominal Pain    Diana Nunez is a 29 y.o. female.  HPI     Pt comes in with cc of abd pain. She is having lower quadrant abd pain, off and on for the last several months. The pain was mild initially, but now more severe and more frequent. The pain is described as sharp,srabbing pain. There is no specific evoking or aggravating or relieving factors. No associated nausea or vomiting or issues with BM. No uti like symptoms. She does feel bloated.  No abd surgery. Periods are irregular, she has mirena. She doesn't have any pelvic issues, but there is family of history ovarian cyst.      Past Medical History:  Diagnosis Date  . Medical history non-contributory     Patient Active Problem List   Diagnosis Date Noted  . NSVD (normal spontaneous vaginal delivery) 03/16/2015  . Active labor 03/15/2015    Past Surgical History:  Procedure Laterality Date  . NO PAST SURGERIES       OB History    Gravida  2   Para  2   Term  2   Preterm      AB      Living  2     SAB      TAB      Ectopic      Multiple  0   Live Births  2           History reviewed. No pertinent family history.  Social History   Tobacco Use  . Smoking status: Never Smoker  . Smokeless tobacco: Never Used  Substance Use Topics  . Alcohol use: No  . Drug use: No    Home Medications Prior to Admission medications   Medication Sig Start Date End Date Taking? Authorizing Provider  acetaminophen (TYLENOL) 500 MG tablet Take 500 mg by mouth every 6 (six) hours as needed for moderate pain.    [provider]  aspirin 325 MG tablet Take 325 mg by mouth daily as needed for moderate pain.    [provider]  baclofen 5 MG TABS Take 5 mg 3 (three) times daily by mouth. Patient not taking:  Reported on 06/06/2019 10/24/17   Felicie Morn, NP  cyclobenzaprine (FLEXERIL) 10 MG tablet Take 1 tablet (10 mg total) by mouth 2 (two) times daily as needed for muscle spasms. Patient not taking: Reported on 06/06/2019 02/09/18   Fayrene Helper, PA-C  ibuprofen (ADVIL,MOTRIN) 600 MG tablet Take 1 tablet (600 mg total) by mouth every 6 (six) hours as needed. Patient not taking: Reported on 06/06/2019 02/09/18   Fayrene Helper, PA-C  levonorgestrel [facility]) 20 MCG/24HR IUD 1 each by Intrauterine route once.    [provider]  metroNIDAZOLE (FLAGYL) 500 MG tablet Take 1 tablet (500 mg total) by mouth 2 (two) times daily. 05/15/20   Tanda Rockers, PA-C    Allergies    Patient has no known allergies.  Review of Systems   Review of Systems  Constitutional: Positive for activity change.  Gastrointestinal: Positive for abdominal pain.  All other systems reviewed and are negative.   Physical Exam Updated Vital Signs BP 128/86 (BP Location: Right Arm)   Pulse (!) 105   Temp 98.6 F (37 C) (Oral)   Resp 18   Ht 5\' 4"  (  1.626 m)   Wt 81.6 kg   SpO2 100%   BMI 30.90 kg/m   Physical Exam Vitals and nursing note reviewed.  Constitutional:      Appearance: She is well-developed.  HENT:     Head: Normocephalic and atraumatic.  Cardiovascular:     Rate and Rhythm: Normal rate.  Pulmonary:     Effort: Pulmonary effort is normal.  Abdominal:     General: Bowel sounds are normal.     Tenderness: There is abdominal tenderness in the right lower quadrant and suprapubic area. There is no guarding or rebound. Negative signs include McBurney's sign.  Musculoskeletal:     Cervical back: Normal range of motion and neck supple.  Skin:    General: Skin is warm and dry.  Neurological:     Mental Status: She is alert and oriented to person, place, and time.     ED Results / Procedures / Treatments   Labs (all labs ordered are listed, but only abnormal results are displayed) Labs Reviewed    CBC - Abnormal; Notable for the following components:      Result Value   WBC 11.2 (*)    All other components within normal limits  LIPASE, BLOOD  COMPREHENSIVE METABOLIC PANEL  URINALYSIS, ROUTINE W REFLEX MICROSCOPIC  I-STAT BETA HCG BLOOD, ED ([facility], [facility], [facility] ONLY)    EKG None  Radiology No results found.  Procedures Procedures (including critical care time)  Medications Ordered in ED Medications - No data to display  ED Course  I have reviewed the triage vital signs and the nursing notes.  Pertinent labs & imaging results that were available during my care of the patient were reviewed by me and considered in my medical decision making (see chart for details).    MDM Rules/Calculators/A&P                          Pt is having intermittent lower quadrant pain, long standing, getting more frequent and more severe. Doubt appendicitis, TOA, PID, kidney stone, ectopic. Cyst possible. [place] ordered.   Final Clinical Impression(s) / ED Diagnoses Final diagnoses:  None    Rx / DC Orders ED Discharge Orders    None       Derwood Kaplan, MD 08/22/20 2135

## 2020-08-22 NOTE — Discharge Instructions (Signed)
The ultrasound showing that he have an ovarian cyst.  We recommend that you take the medication prescribed.  Please follow-up with gynecologist by contacting the [facility] at the number provided.

## 2020-08-22 NOTE — ED Triage Notes (Addendum)
Interpreter Diana Nunez # [id number] Pt reports that she got a new Mirena implanted about 3 months ago. Yesterday, she reports that she started experiencing "strong" lower abdominal cramps and back pain. Denies dysuria. Denies vomiting, nausea, or diarrhea. States that she does not have a period because of the implant.

## 2020-12-14 ENCOUNTER — Encounter (HOSPITAL_COMMUNITY): Payer: Self-pay | Admitting: Obstetrics and Gynecology

## 2020-12-14 DIAGNOSIS — F4312 Post-traumatic stress disorder, chronic: Secondary | ICD-10-CM | POA: Insufficient documentation

## 2020-12-14 DIAGNOSIS — G4700 Insomnia, unspecified: Secondary | ICD-10-CM | POA: Insufficient documentation
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
## 2020-12-15 ENCOUNTER — Emergency Department (HOSPITAL_COMMUNITY)
Admission: EM | Admit: 2020-12-15 | Discharge: 2020-12-15 | Disposition: A | Discharge status: Home / Self Care | Payer: Self-pay | Attending: Emergency Medicine | Admitting: Emergency Medicine

## 2020-12-15 DIAGNOSIS — F4310 Post-traumatic stress disorder, unspecified: Secondary | ICD-10-CM

## 2020-12-15 DIAGNOSIS — G4700 Insomnia, unspecified: Secondary | ICD-10-CM

## 2020-12-15 MED ORDER — HYDROXYZINE HCL 25 MG PO TABS
25.0000 mg | ORAL_TABLET | Freq: Four times a day (QID) | ORAL | 0 refills | Status: DC | PRN
Start: 2020-12-15 — End: 2021-03-10

## 2020-12-15 NOTE — Discharge Instructions (Signed)
Please call [facility] in the morning.
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
## 2021-01-15 LAB — GLUCOSE, POCT (MANUAL RESULT ENTRY): POC Glucose: 98 mg/dl (ref 70–99)

## 2021-01-26 ENCOUNTER — Ambulatory Visit: Payer: Self-pay | Attending: Family Medicine | Admitting: Family Medicine

## 2021-01-26 ENCOUNTER — Encounter: Payer: Self-pay | Admitting: Family Medicine

## 2021-01-26 ENCOUNTER — Other Ambulatory Visit: Payer: Self-pay

## 2021-01-26 VITALS — BP 113/71 | HR 89 | Ht 64.0 in | Wt 179.0 lb

## 2021-01-26 DIAGNOSIS — R519 Headache, unspecified: Secondary | ICD-10-CM

## 2021-01-26 DIAGNOSIS — R102 Pelvic and perineal pain: Secondary | ICD-10-CM

## 2021-01-26 LAB — POCT URINALYSIS DIP (CLINITEK)
Bilirubin, UA: NEGATIVE
Blood, UA: NEGATIVE
Glucose, UA: NEGATIVE mg/dL
Ketones, POC UA: NEGATIVE mg/dL
Leukocytes, UA: NEGATIVE
Nitrite, UA: NEGATIVE
POC PROTEIN,UA: NEGATIVE
Spec Grav, UA: 1.01 (ref 1.010–1.025)
Urobilinogen, UA: 0.2 E.U./dL
pH, UA: 7 (ref 5.0–8.0)

## 2021-01-26 MED ORDER — CETIRIZINE HCL 10 MG PO TABS: 10.0000 mg | ORAL_TABLET | Freq: Every day | ORAL | 1 refills | Status: AC

## 2021-01-26 NOTE — Progress Notes (Signed)
Having pain on right side of abdomen.

## 2021-01-26 NOTE — Progress Notes (Signed)
Subjective:  Patient ID: Diana Nunez, female    DOB: 02-Mar-1991  Age: 30 y.o. MRN: [id number]  CC: New Patient (Initial Visit)   HPI Kayron Kalmar presents to establish care. She has had RLQ pain for the last year and increased in intensity over the last 4 months; it waxes and wanes and with her monthly cycles the pain is worse. She has had Nexplanon in place for the last 9 months. LMP was 3 days ago and it lasts 7-8 days, regular cycles, heavy periods with some clots  US Pelvis 08/2020 revealed:  IMPRESSION: 4.8 cm simple appearing cyst in the right ovary. No follow up imaging recommended. Note: This recommendation does not apply to premenarchal patients or to those with increased risk (genetic, family history, elevated tumor markers or other high-risk factors) of ovarian cancer. Reference: Radiology 2019 Nov; 293(2):359-371.  No evidence of torsion.   Complains of headaches for the last week worse in the mornings with associated redness of her throat but denies presence of postnasal drip, sinus pressure or pain, no rhinorrhea or nasal congestion. Past Medical History:  Diagnosis Date  . Medical history non-contributory     Past Surgical History:  Procedure Laterality Date  . NO PAST SURGERIES      No family history on file.  No Known Allergies  Outpatient Medications Prior to Visit  Medication Sig Dispense Refill  . acetaminophen (TYLENOL) 500 MG tablet Take 1 tablet (500 mg total) by mouth every 6 (six) hours as needed for moderate pain. (Patient not taking: Reported on 01/26/2021) 30 tablet 0  . aspirin 325 MG tablet Take 325 mg by mouth daily as needed for moderate pain. (Patient not taking: Reported on 01/26/2021)    . hydrOXYzine (ATARAX/VISTARIL) 25 MG tablet Take 1 tablet (25 mg total) by mouth every 6 (six) hours as needed for anxiety. (Patient not taking: Reported on 01/26/2021) 12 tablet 0  . ibuprofen (ADVIL,MOTRIN) 600 MG tablet Take 1  tablet (600 mg total) by mouth every 6 (six) hours as needed. (Patient not taking: No sig reported) 30 tablet 0  . levonorgestrel (MIRENA) 20 MCG/24HR IUD 1 each by Intrauterine route once. (Patient not taking: Reported on 01/26/2021)    . acetaminophen (TYLENOL 8 HOUR) 650 MG CR tablet Take 1 tablet (650 mg total) by mouth every 8 (eight) hours as needed. (Patient not taking: Reported on 01/26/2021) 30 tablet 0  . baclofen 5 MG TABS Take 5 mg 3 (three) times daily by mouth. (Patient not taking: No sig reported) 30 tablet 0  . cyclobenzaprine (FLEXERIL) 10 MG tablet Take 1 tablet (10 mg total) by mouth 2 (two) times daily as needed for muscle spasms. (Patient not taking: No sig reported) 20 tablet 0  . metroNIDAZOLE (FLAGYL) 500 MG tablet Take 1 tablet (500 mg total) by mouth 2 (two) times daily. (Patient not taking: Reported on 01/26/2021) 14 tablet 0  . naproxen (NAPROSYN) 500 MG tablet Take 1 tablet (500 mg total) by mouth 2 (two) times daily. (Patient not taking: Reported on 01/26/2021) 30 tablet 0   No facility-administered medications prior to visit.     ROS Review of Systems  Constitutional: Negative for activity change, appetite change and fatigue.  HENT: Negative for congestion, postnasal drip, sinus pressure and sore throat.   Eyes: Negative for visual disturbance.  Respiratory: Negative for cough, chest tightness, shortness of breath and wheezing.   Cardiovascular: Negative for chest pain and palpitations.  Gastrointestinal: Positive for abdominal distention. Negative for  abdominal pain and constipation.  Endocrine: Negative for polydipsia.  Genitourinary: Negative for dysuria and frequency.  Musculoskeletal: Negative for arthralgias and back pain.  Skin: Negative for rash.  Neurological: Positive for headaches. Negative for tremors, light-headedness and numbness.  Hematological: Does not bruise/bleed easily.  Psychiatric/Behavioral: Negative for agitation and behavioral problems.     Objective:  BP 113/71   Pulse 89   Ht 5\' 4"  (1.626 m)   Wt 179 lb (81.2 kg)   SpO2 99%   BMI 30.73 kg/m   BP/Weight 01/26/2021 12/15/2020 12/14/2020  Systolic BP 113 130 -  Diastolic BP 71 74 -  Wt. (Lbs) 179 - 180  BMI 30.73 35.15 -      Physical Exam Constitutional:      Appearance: She is well-developed.  HENT:     Mouth/Throat:     Comments: Minimal right-sided oropharyngeal erythema Neck:     Vascular: No JVD.  Cardiovascular:     Rate and Rhythm: Normal rate.     Heart sounds: Normal heart sounds. No murmur heard.   Pulmonary:     Effort: Pulmonary effort is normal.     Breath sounds: Normal breath sounds. No wheezing or rales.  Chest:     Chest wall: No tenderness.  Abdominal:     General: Bowel sounds are normal. There is no distension.     Palpations: Abdomen is soft. There is no mass.     Tenderness: There is no abdominal tenderness.  Musculoskeletal:        General: Normal range of motion.     Right lower leg: No edema.     Left lower leg: No edema.  Neurological:     Mental Status: She is alert and oriented to person, place, and time.  Psychiatric:        Mood and Affect: Mood normal.     CMP Latest Ref Rng & Units 08/22/2020 05/15/2020 06/06/2019  Glucose 70 - 99 mg/dL 06/08/2019) 992(E) 268(T)  BUN 6 - 20 mg/dL 11 9 14   Creatinine 0.44 - 1.00 mg/dL 419(Q 2.22  Sodium 135 - 145 mmol/L 140 139 139  Potassium 3.5 - 5.1 mmol/L 3.7 3.4(L) 3.9  Chloride 98 - 111 mmol/L 106 107 105  CO2 22 - 32 mmol/L 23 25 24   Calcium 8.9 - 10.3 mg/dL 9.4 9.79) 9.5  Total Protein 6.5 - 8.1 g/dL 7.6 7.4 -  Total Bilirubin 0.3 - 1.2 mg/dL 0.6 0.3 -  Alkaline Phos 38 - 126 U/L 64 54 -  AST 15 - 41 U/L 24 20 -  ALT 0 - 44 U/L 31 31 -    Lipid Panel  No results found for: CHOL, TRIG, HDL, CHOLHDL, VLDL, LDLCALC, LDLDIRECT  CBC    Component Value Date/Time   WBC 11.2 (H) 08/22/2020 1927   RBC 4.76 08/22/2020 1927   HGB 14.9 08/22/2020 1927   HCT 44.5  08/22/2020 1927   PLT 291 08/22/2020 1927   MCV 93.5 08/22/2020 1927   MCH 31.3 08/22/2020 1927   MCHC 33.5 08/22/2020 1927   RDW 12.5 08/22/2020 1927   LYMPHSABS 3.0 10/30/2017 0434   MONOABS 1.3 (H) 10/30/2017 0434   EOSABS 0.4 10/30/2017 0434   BASOSABS 0.0 10/30/2017 0434    No results found for: HGBA1C  Assessment & Plan:  1. Pelvic pain We will check UA and cervicovaginal ancillary tests UA is negative for UTI Given the presence of ovarian cysts, referred to GYN. - Cervicovaginal ancillary only -  Ambulatory referral to Gynecology  2. Sinus headache - cetirizine (ZYRTEC) 10 MG tablet; Take 1 tablet (10 mg total) by mouth daily.  Dispense: 30 tablet; Refill: 1   Meds ordered this encounter  Medications  . cetirizine (ZYRTEC) 10 MG tablet    Sig: Take 1 tablet (10 mg total) by mouth daily.    Dispense:  30 tablet    Refill:  1    Follow-up: Return in about 3 months (around 04/25/2021) for Coordination of care.       Hoy Register, MD, FAAFP. [facility] [place], [place] [phone]   01/26/2021, 2:06 PM

## 2021-01-27 LAB — CERVICOVAGINAL ANCILLARY ONLY
Bacterial Vaginitis (gardnerella): NEGATIVE
Candida Glabrata: NEGATIVE
Candida Vaginitis: NEGATIVE
Chlamydia: NEGATIVE
Comment: NEGATIVE
Comment: NEGATIVE
Comment: NEGATIVE
Comment: NEGATIVE
Comment: NEGATIVE
Comment: NORMAL
Neisseria Gonorrhea: NEGATIVE
Trichomonas: NEGATIVE

## 2021-02-05 ENCOUNTER — Telehealth: Payer: Self-pay

## 2021-02-05 NOTE — Telephone Encounter (Signed)
Pt was called and there was no VM set up to leave message.

## 2021-02-05 NOTE — Telephone Encounter (Signed)
-----   Message from Hoy Register, MD sent at 01/29/2021 10:46 AM EST ----- Vaginal ancillary test is normal.

## 2021-03-10 ENCOUNTER — Ambulatory Visit (INDEPENDENT_AMBULATORY_CARE_PROVIDER_SITE_OTHER): Payer: Self-pay | Admitting: Obstetrics & Gynecology

## 2021-03-10 ENCOUNTER — Other Ambulatory Visit: Payer: Self-pay

## 2021-03-10 ENCOUNTER — Encounter: Payer: Self-pay | Admitting: Obstetrics & Gynecology

## 2021-03-10 VITALS — BP 115/76 | HR 88 | Ht 64.0 in | Wt 178.2 lb

## 2021-03-10 DIAGNOSIS — Z975 Presence of (intrauterine) contraceptive device: Secondary | ICD-10-CM

## 2021-03-10 DIAGNOSIS — R102 Pelvic and perineal pain: Secondary | ICD-10-CM

## 2021-03-10 DIAGNOSIS — N83201 Unspecified ovarian cyst, right side: Secondary | ICD-10-CM

## 2021-03-10 MED ORDER — IBUPROFEN 600 MG PO TABS: 600.0000 mg | ORAL_TABLET | Freq: Four times a day (QID) | ORAL | 2 refills | Status: AC | PRN

## 2021-03-10 NOTE — Progress Notes (Signed)
GYNECOLOGY OFFICE VISIT NOTE  History:   Diana Nunez is a 30 y.o. [phone] here today for discussion about left ovarian cyst and pelvic pain, has Nexplanon in place since 04/2020.  Patient is Spanish-speaking only, interpreter present for this encounter.  Reports history of frequent ovarian cysts in the past, was on OCPs, IUD in the past but came off them due to weight gain and migraines. Has noticed more cysts on Nexplanon.  Ultrasound in 08/2020 showed physiologic simple left ovarian cyst.  Also had more frequent periods (two a month about 14 days apart) since the last three months, negative cervicovaginal ancillary recently. She denies current abnormal vaginal discharge, bleeding, pelvic pain or other concerns.    Past Medical History:  Diagnosis Date  . Medical history non-contributory     Past Surgical History:  Procedure Laterality Date  . NO PAST SURGERIES      The following portions of the patient's history were reviewed and updated as appropriate: allergies, current medications, past family history, past medical history, past social history, past surgical history and problem list.   Health Maintenance:  Normal pap at [facility] two years ago  Review of Systems:  Pertinent items noted in HPI and remainder of comprehensive ROS otherwise negative.  Physical Exam:  BP 115/76   Pulse 88   Ht 5\' 4"  (1.626 m)   Wt 178 lb 3.2 oz (80.8 kg)   BMI 30.59 kg/m  CONSTITUTIONAL: Well-developed, well-nourished female in no acute distress.  HEENT:  Normocephalic, atraumatic. External right and left ear normal. No scleral icterus.  NECK: Normal range of motion, supple, no masses noted on observation SKIN: No rash noted. Not diaphoretic. No erythema. No pallor. MUSCULOSKELETAL: Normal range of motion. No edema noted. NEUROLOGIC: Alert and oriented to person, place, and time. Normal muscle tone coordination. No cranial nerve deficit noted. PSYCHIATRIC: Normal mood and affect. Normal  behavior. Normal judgment and thought content. CARDIOVASCULAR: Normal heart rate noted RESPIRATORY: Effort and breath sounds normal, no problems with respiration noted ABDOMEN: No masses noted. No other overt distention noted.  Mild RLQ tenderness with deep palpation, no rebound/guarding.  PELVIC: Deferred  Labs and Imaging 08/22/2020 TRANSABDOMINAL AND TRANSVAGINAL ULTRASOUND OF PELVIS DOPPLER ULTRASOUND OF OVARIES CLINICAL DATA:  Pelvic pain TECHNIQUE: Both transabdominal and transvaginal ultrasound examinations of the pelvis were performed. Transabdominal technique was performed for global imaging of the pelvis including uterus, ovaries, adnexal regions, and pelvic cul-de-sac. It was necessary to proceed with endovaginal exam following the transabdominal exam to visualize the uterus, endometrium, ovaries and adnexa. Color and duplex Doppler ultrasound was utilized to evaluate blood flow to the ovaries. COMPARISON:  None. FINDINGS: Uterus Measurements: 7.1 x 5.0 x 5.8 cm = volume: 109 mL. No fibroids or other mass visualized. Endometrium Thickness: 4 mm in thickness.  No focal abnormality visualized. Right ovary Measurements: 5.6 x 3.9 x 3.9 cm = volume: 45 mL. 4.8 cm cyst which appears simple. Left ovary Measurements: 2.6 x 1.8 x 2.4 cm = volume: 5.7 mL. Normal appearance/no adnexal mass. Pulsed Doppler evaluation of both ovaries demonstrates normal low-resistance arterial and venous waveforms. Other findings No abnormal free fluid.  IMPRESSION: 4.8 cm simple appearing cyst in the right ovary. No follow up imaging recommended. Note: This recommendation does not apply to premenarchal patients or to those with increased risk (genetic, family history, elevated tumor markers or other high-risk factors) of ovarian cancer. Reference: Radiology 2019 Nov; 293(2):359-371.  No evidence of torsion.   Electronically Signed   By:  Charlett Nose M.D.   On:  08/22/2020 22:51   No results found for this or any previous visit (from the past 168 hour(s)). No results found.    Assessment and Plan:      1. Cyst of right ovary 2. Pelvic pain in female 3. Nexplanon in place and irregular bleeding Reassured about benign cysts. Nexplanon can be associated with cysts and irregular bleeding. Discussed possibility of changing contraception modality, going back to pills (stopped due to weight gain, and watching diet closely) as this could help with ovarian cyst suppression more effectively and help with bleeding pattern. She will think about this. For now, Ibuprofen prescribed as needed. Will also get another ultrasound (ordered at [facility]) due to cost, will follow up results and manage accordingly. - ibuprofen (ADVIL) 600 MG tablet; Take 1 tablet (600 mg total) by mouth every 6 (six) hours as needed for headache, mild pain, moderate pain or cramping.  Dispense: 30 tablet; Refill: 2 - US PELVIC COMPLETE WITH TRANSVAGINAL; Future  Routine preventative health maintenance measures emphasized. Please refer to After Visit Summary for other counseling recommendations.   Return for any gynecologic concerns.    I spent 30 minutes dedicated to the care of this patient including pre-visit review of records, face to face time with the patient discussing her conditions and treatments and post visit ordering of testing.    Jaynie Collins, MD, FACOG Obstetrician & Gynecologist, [facility] for [employer], [facility]

## 2021-03-10 NOTE — Patient Instructions (Signed)
Dolor plvico en la mujer Pelvic Pain, Female El dolor plvico se siente en la parte inferior del abdomen, debajo del ombligo y a nivel de las caderas. El dolor puede comenzar en forma repentina (ser [employer]), reaparecer (ser recurrente) o durar mucho tiempo (volverse crnico). El dolor plvico que dura ms de 6 meses se considera crnico. El dolor plvico puede afectar:  Los rganos genitales.  El sistema urinario.  El tubo digestivo.  El sistema musculoesqueltico. El dolor plvico puede tener muchas causas posibles. A veces, el dolor puede ser una consecuencia de afecciones digestivas o urinarias, msculos o ligamentos distendidos, o afecciones del aparato reproductivo. A veces, la causa del dolor plvico no se conoce. Siga estas indicaciones en su casa:  Tome los medicamentos de venta libre y los recetados solamente como se lo haya indicado el mdico.  Haga reposo como se lo haya indicado el mdico.  No tenga relaciones sexuales si siente dolor.  Lleve un registro del dolor plvico. Escriba los siguientes datos: ? Cundo Radiation protection practitioner. ? La ubicacin del dolor. ? Qu parece mejorar o empeorar el dolor, como alimentos o el perodo (ciclo menstrual). ? Cualquier sntoma que se presente junto con Chief Technology Officer.  Concurra a todas las visitas de control como se lo haya indicado el mdico. Esto es importante.   Comunquese con un mdico si:  Los medicamentos no Tourist information centre manager.  El dolor regresa.  Aparecen nuevos sntomas.  Tiene sangrado o secrecin vaginal anormal, lo que incluye sangrado despus de la menopausia.  Tiene fiebre o escalofros.  Tiene estreimiento.  Observa sangre en la orina o en las heces.  La orina tiene mal olor.  Se siente dbil o siente que va a desvanecerse. Solicite ayuda inmediatamente si:  Tiene un dolor repentino e intenso.  El dolor se intensifica de [place] continua.  Siente dolor intenso junto con fiebre, nuseas, vmitos o sudoracin  excesiva.  Pierde el conocimiento. Resumen  El dolor plvico se siente en la parte inferior del abdomen, debajo del ombligo y a nivel de las caderas.  El dolor plvico puede tener muchas causas posibles.  Lleve un registro del dolor plvico. Esta informacin no tiene Theme park manager el consejo del mdico. Asegrese de hacerle al mdico cualquier pregunta que tenga. Document Revised: 07/26/2018 Document Reviewed: 07/26/2018 Elsevier Patient Education  2021 [employer].

## 2021-04-25 ENCOUNTER — Encounter (HOSPITAL_COMMUNITY): Payer: Self-pay | Admitting: Emergency Medicine

## 2021-04-25 ENCOUNTER — Emergency Department (HOSPITAL_COMMUNITY)
Admission: EM | Admit: 2021-04-25 | Discharge: 2021-04-25 | Disposition: A | Discharge status: Home / Self Care | Payer: Self-pay | Attending: Emergency Medicine | Admitting: Emergency Medicine

## 2021-04-25 ENCOUNTER — Other Ambulatory Visit: Payer: Self-pay

## 2021-04-25 DIAGNOSIS — R45851 Suicidal ideations: Secondary | ICD-10-CM | POA: Insufficient documentation

## 2021-04-25 DIAGNOSIS — Z20822 Contact with and (suspected) exposure to covid-19: Secondary | ICD-10-CM | POA: Insufficient documentation

## 2021-04-25 DIAGNOSIS — F322 Major depressive disorder, single episode, severe without psychotic features: Secondary | ICD-10-CM | POA: Insufficient documentation

## 2021-04-25 DIAGNOSIS — F32A Depression, unspecified: Secondary | ICD-10-CM

## 2021-04-25 LAB — RAPID URINE DRUG SCREEN, HOSP PERFORMED
Amphetamines: NOT DETECTED
Barbiturates: NOT DETECTED
Benzodiazepines: NOT DETECTED
Cocaine: NOT DETECTED
Opiates: NOT DETECTED
Tetrahydrocannabinol: NOT DETECTED

## 2021-04-25 LAB — COMPREHENSIVE METABOLIC PANEL
ALT: 13 U/L (ref 0–44)
AST: 17 U/L (ref 15–41)
Albumin: 4 g/dL (ref 3.5–5.0)
Alkaline Phosphatase: 63 U/L (ref 38–126)
Anion gap: 9 (ref 5–15)
BUN: 17 mg/dL (ref 6–20)
CO2: 21 mmol/L — ABNORMAL LOW (ref 22–32)
Calcium: 9.3 mg/dL (ref 8.9–10.3)
Chloride: 110 mmol/L (ref 98–111)
Creatinine, Ser: 0.8 mg/dL (ref 0.44–1.00)
GFR, Estimated: >60 mL/min (ref >60)
Glucose, Bld: 116 mg/dL — ABNORMAL HIGH (ref 70–99)
Potassium: 3.8 mmol/L (ref 3.5–5.1)
Sodium: 140 mmol/L (ref 135–145)
Total Bilirubin: 0.2 mg/dL — ABNORMAL LOW (ref 0.3–1.2)
Total Protein: 7.9 g/dL (ref 6.5–8.1)

## 2021-04-25 LAB — CBC
HCT: 40.7 % (ref 36.0–46.0)
Hemoglobin: 13.7 g/dL (ref 12.0–15.0)
MCH: 31.5 pg (ref 26.0–34.0)
MCHC: 33.7 g/dL (ref 30.0–36.0)
MCV: 93.6 fL (ref 80.0–100.0)
Platelets: 291 10*3/uL (ref 150–400)
RBC: 4.35 MIL/uL (ref 3.87–5.11)
RDW: 12.3 % (ref 11.5–15.5)
WBC: 12.5 10*3/uL — ABNORMAL HIGH (ref 4.0–10.5)
nRBC: 0 % (ref 0.0–0.2)

## 2021-04-25 LAB — HCG, QUANTITATIVE, PREGNANCY: hCG, Beta Chain, Quant, S: <1 m[IU]/mL (ref <5)

## 2021-04-25 LAB — RESP PANEL BY RT-PCR (FLU A&B, COVID) ARPGX2
Influenza A by PCR: NEGATIVE
Influenza B by PCR: NEGATIVE
SARS Coronavirus 2 by RT PCR: NEGATIVE

## 2021-04-25 LAB — ETHANOL: Alcohol, Ethyl (B): <10 mg/dL (ref <10)

## 2021-04-25 MED ORDER — IBUPROFEN 800 MG PO TABS
800.0000 mg | ORAL_TABLET | Freq: Once | ORAL | Status: AC
  Administered 2021-04-25: 800 mg via ORAL
  Filled 2021-04-25: qty 1

## 2021-04-25 NOTE — ED Notes (Signed)
Discharged paper explain to the patient using interpreter (Spanish) Elmyra Ricks # [phone]. Patient verbalized understanding.

## 2021-04-25 NOTE — ED Notes (Signed)
Pt belongings obtained and pt dressed out into burgundy scrubs. Belongings obtained are secured in two bags:  Emergency planning/management officer [employer] shoes

## 2021-04-25 NOTE — Discharge Instructions (Signed)
follow-up at the [facility] located at [address], [place] [phone].You will go to an open access appointment at 8:00 am on the second floor of the [facility] , bring ID with you.

## 2021-04-25 NOTE — ED Notes (Addendum)
Using Spanish intrepreter Salome Spotted [phone], pt asking when she will go home. Her spouse is at home with their children and he needs to go to work. Explained we are waiting on mental health team to speak with pt, and I am uncertain when they will round. Apologized for the wait.

## 2021-04-25 NOTE — ED Notes (Signed)
TTS in room. Pt is a Insurance claims handler. Pt told this writer "I know I have depression. I have these episodes that are triggered. I will cry for hours and hours.". This Clinical research associate encouraged pt to tell this information to the TTS machine as it will help her find the help she needs. RN is aware of this Clinical research associate and pt's conversation.

## 2021-04-25 NOTE — ED Provider Notes (Signed)
[facility]-EMERGENCY DEPT Provider Note   CSN: [id number] Arrival date & time: 04/25/21  0414     History Chief Complaint  Patient presents with  . Depression    Diana Nunez is a 30 y.o. female.  The history is provided by the patient and medical records.  Depression   30 y.o. F presenting to the ED with worsening depression following death of her father.  She does not have a good local support system so has had a hard time processing this on her own. She admits she has had prior thoughts of suicide but denies this currently.  She does not have active plan of suicide.  She denies HI/AVH.  She is not currently on any medications for anxiety/depression.  Past Medical History:  Diagnosis Date  . Medical history non-contributory     There are no problems to display for this patient.   Past Surgical History:  Procedure Laterality Date  . NO PAST SURGERIES       OB History    Gravida  2   Para  2   Term  2   Preterm      AB      Living  2     SAB      IAB      Ectopic      Multiple  0   Live Births  2           No family history on file.  Social History   Tobacco Use  . Smoking status: Never Smoker  . Smokeless tobacco: Never Used  Substance Use Topics  . Alcohol use: No  . Drug use: No    Home Medications Prior to Admission medications   Medication Sig Start Date End Date Taking? Authorizing Provider  cetirizine (ZYRTEC) 10 MG tablet Take 1 tablet (10 mg total) by mouth daily. 01/26/21   Hoy Register, MD  ibuprofen (ADVIL) 600 MG tablet Take 1 tablet (600 mg total) by mouth every 6 (six) hours as needed for headache, mild pain, moderate pain or cramping. 03/10/21   Anyanwu, Jethro Bastos, MD  levonorgestrel (MIRENA) 20 MCG/24HR IUD 1 each by Intrauterine route once. Patient not taking: No sig reported    [provider]    Allergies    Patient has no known allergies.  Review of Systems   Review  of Systems  Psychiatric/Behavioral: Positive for depression.       Depression  All other systems reviewed and are negative.   Physical Exam Updated Vital Signs BP 130/83   Pulse (!) 105   Temp 98.2 F (36.8 C) (Oral)   Resp 18   SpO2 99%   Physical Exam Vitals and nursing note reviewed.  Constitutional:      Appearance: She is well-developed.  HENT:     Head: Normocephalic and atraumatic.  Eyes:     Conjunctiva/sclera: Conjunctivae normal.     Pupils: Pupils are equal, round, and reactive to light.  Cardiovascular:     Rate and Rhythm: Normal rate and regular rhythm.     Heart sounds: Normal heart sounds.  Pulmonary:     Effort: Pulmonary effort is normal.     Breath sounds: Normal breath sounds.  Abdominal:     General: Bowel sounds are normal.     Palpations: Abdomen is soft.     Tenderness: There is no abdominal tenderness. There is no rebound.  Musculoskeletal:        General: Normal range  of motion.     Cervical back: Normal range of motion.  Skin:    General: Skin is warm and dry.  Neurological:     Mental Status: She is alert and oriented to person, place, and time.  Psychiatric:        Mood and Affect: Mood is depressed.     Comments: Appears depressed     ED Results / Procedures / Treatments   Labs (all labs ordered are listed, but only abnormal results are displayed) Labs Reviewed  COMPREHENSIVE METABOLIC PANEL  ETHANOL  CBC  RAPID URINE DRUG SCREEN, HOSP PERFORMED  HCG, QUANTITATIVE, PREGNANCY    EKG None  Radiology No results found.  Procedures Procedures   Medications Ordered in ED Medications - No data to display  ED Course  I have reviewed the triage vital signs and the nursing notes.  Pertinent labs & imaging results that were available during my care of the patient were reviewed by me and considered in my medical decision making (see chart for details).    MDM Rules/Calculators/A&P  30 year old female presenting to the ED  with worsening depression after the death of her father.  Has had some suicidal thoughts but denies any active plan.  She is not currently on any type of psychiatric medications and has not established with a mental health provider.  No physical concerns at present.  Labs are overall reassuring.  Medically cleared.  Will get TTS consult.  Final Clinical Impression(s) / ED Diagnoses Final diagnoses:  Depression, unspecified depression type    Rx / DC Orders ED Discharge Orders    None       Garlon Hatchet, PA-C 04/25/21 0615    Sabas Sous, MD 04/25/21 [phone]

## 2021-04-25 NOTE — ED Triage Notes (Signed)
Interpreter Donald Pore used for triage. Patient presents complaining of worsening depression symptoms. Patient states that since her dad died, she has been dealing with depression due to lack of support surrounding the event. Patient endorses passive SI, but no active suicidality or plan. Patient tearful during triage.

## 2021-04-25 NOTE — BH Assessment (Signed)
Comprehensive Clinical Assessment (CCA) Note  04/25/2021 Diana Nunez [id number]   Disposition:  Per Maxie Barb, NP, patient can be psych cleared to follow-up with the [facility] tomorrow, 5/9, for a walk-in appointment.  The patient demonstrates the following risk factors for suicide: Chronic risk factors for suicide include: Patient has been depressed for two years, she lacks emotional support and she is not currently working. Acute risk factors for suicide include: Patient states that her father died two years ago, her son with ADHD stresses her out, she is currently not working, she has no current suicidal thoughts. Protective factors for this patient include: Patient has no history of prior suicide attempts and she states that she would like to get better for her children's sake. Considering these factors, the overall suicide risk at this point appears to be low. Patient is appropriate for outpatient follow up.  GAD-7   Flowsheet Row [facility] Office Visit from 01/26/2021 in [facility]  Total GAD-7 Score 12 6    PHQ2-9   Flowsheet Row ED from 04/25/2021 in [facility] Office Visit from 03/10/2021 in [facility] Office Visit from 01/26/2021 in [facility]  PHQ-2 Total Score 4 2 2   PHQ-9 Total Score 17 9 8     Flowsheet Row ED from 04/25/2021 in [facility]  C-SSRS RISK CATEGORY No Risk       Patient presented to the [facility] voluntarily seeking help for her depression and her anxiety.  She states that she has been struggling with depression and anxiety for the past two years.  Contributing factors for her depression include the death of her father, she has a child who has ADHD and keeps her stressed out, her husband lacks  empathy and understading of her struggles and she states that s she works as a and states that she has not been able to work as she normally would.  Patient states that her family is supportive and she is really close to her mother, but states that she does not want to burden her with her problems.  Patient denies SI/HI/Psychosis and states that she has never tired to hurt herself or anyone else in the past and states that she has never had any treatment in the past for her depression.  Patient denies a history of any substance abuse.  Patient states that she does not sleep, but two to three hours per night and states that she has experienced a decrease in her appetite, but states that she has not lost any weight.  She denies a history of abuse or self-mutilation.  Patient is alert and oriented.  Her mood is depressed and her affect is tearful.  She does not appear to be responding to any internal stimuli.  Her judgment, insight and impulse control are mostly intact.  Her thoughts are organized and her memory intact.  TTS poke to patient's husband, 06/25/2021, [phone], who states that he has no concerns about patient trying to hurt herself and states that he is supportive of her getting help.  He states that he will make sure she keeps her appointment at the [facility] tomorrow.  He states that he and his mother-in-law will watch her over the next few days to ensure her safety.  Chief Complaint:  Chief Complaint  Patient presents with  . 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 Self  Referral name: No data recorded Referral phone number: No data recorded  Whom do you see for routine medical problems? I don't have a doctor  Practice/Facility Name: No data recorded Practice/Facility Phone Number: No data recorded Name of Contact: No data recorded Contact  Number: No data recorded Contact Fax Number: No data recorded Prescriber Name: No data recorded Prescriber Address (if known): No data recorded  What Is the Reason for Your Visit/Call Today? Patient states that she is depressed and she has anxiety  How Long Has This Been Causing You Problems? > than 6 months  What Do You Feel Would Help You the Most Today? Treatment for Depression or other mood problem   Have You Recently Been in Any Inpatient Treatment (Hospital/Detox/Crisis Center/28-Day Program)? No  Name/Location of Program/Hospital:No data recorded How Long Were You There? No data recorded When Were You Discharged? No data recorded  Have You Ever Received Services From [facility] Before? Yes  Who Do You See at [facility]? patient has been in the ED on several occasions in the past   Have You Recently Had Any Thoughts About Hurting Yourself? No  Are You Planning to Commit Suicide/Harm Yourself At This time? No   Have you Recently Had Thoughts About Hurting Someone Karolee Ohs? No  Explanation: No data recorded  Have You Used Any Alcohol or Drugs in the Past 24 Hours? No  How Long Ago Did You Use Drugs or Alcohol? No data recorded What Did You Use and How Much? No data recorded  Do You Currently Have a Therapist/Psychiatrist? No  Name of Therapist/Psychiatrist: No data recorded  Have You Been Recently Discharged From Any Office Practice or Programs? No  Explanation of Discharge From Practice/Program: No data recorded    CCA Screening Triage Referral Assessment Type of Contact: Tele-Assessment  Is this Initial or Reassessment? Initial Assessment  Date Telepsych consult ordered in [facility]:  04/25/2021  Time Telepsych consult ordered in [facility]:  0602   Patient Reported Information Reviewed? Yes  Patient Left Without Being Seen? No data recorded Reason for Not Completing Assessment: No data recorded  Collateral Involvement: none available   Does Patient Have a Court  Appointed Legal Guardian? No data recorded Name and Contact of Legal Guardian: No data recorded If Minor and Not Living with Parent(s), Who has Custody? No data recorded Is CPS involved or ever been involved? Never  Is APS involved or ever been involved? Never   Patient Determined To Be At Risk for Harm To Self or Others Based on Review of Patient Reported Information or Presenting Complaint? No  Method: No data recorded Availability of Means: No data recorded Intent: No data recorded Notification Required: No data recorded Additional Information for Danger to Others Potential: No data recorded Additional Comments for Danger to Others Potential: No data recorded Are There Guns or Other Weapons in Your Home? No data recorded Types of Guns/Weapons: No data recorded Are These Weapons Safely Secured?                            No data recorded Who Could Verify You Are Able To Have These Secured: No data recorded Do You Have any Outstanding Charges, Pending Court Dates, Parole/Probation? No data recorded Contacted To Inform  of Risk of Harm To Self or Others: No data recorded  Location of Assessment: [facility] ED   Does Patient Present under Involuntary Commitment? No  IVC Papers Initial File Date: No data recorded  [place] of Residence: [place]   Patient Currently Receiving the Following Services: Not Receiving Services   Determination of Need: No data recorded  Options For Referral: [facility]; Medication Management; Outpatient Therapy     CCA Biopsychosocial Intake/Chief Complaint:  [facility] presented to the [facility] voluntarily seeking help for her depression and her anxiety.  She states that she has been struggling with depression and anxiety for the past two years.  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  Patient states that her family is supportive and she is really close to her mother, but states that she does not want to burden her with her problems.  Patient denies SI/HI/Psychosis and states that she has never tired to hurt herself or anyone else in the past and states that she has never had any treatment in the past for her depression.  Patient denies a history of any substance abuse.  Patient states that she does not sleep, but two to three hours per night and states that she has experienced a decrease in her appetite, but states that she has not lost any weight.  She denies a history of abuse or self-mutilation.  Current Symptoms/Problems: depressed mood and moderate anxiety   Patient Reported Schizophrenia/Schizoaffective Diagnosis in Past: No data recorded  Strengths: Patient states that she is dependable  Preferences: Patient has no special needs that require accommodation  Abilities: Patient states that she is good at cooking and taking care of her family   Type of Services Patient Feels are Needed: Outpatient therapy and medication management   Initial Clinical Notes/Concerns: No data recorded  Mental Health Symptoms Depression:  Change in energy/activity; Increase/decrease in appetite; Sleep (too much or little); Tearfulness   Duration of Depressive symptoms: Greater than two weeks   Mania:  None   Anxiety:   Sleep; Restlessness; Tension; Worrying   Psychosis:  None   Duration of Psychotic symptoms: No data recorded  Trauma:  None   Obsessions:  None   Compulsions:  None   Inattention:  None   Hyperactivity/Impulsivity:  N/A   Oppositional/Defiant Behaviors:  None   Emotional Irregularity:  None   Other Mood/Personality Symptoms:  No data recorded   Mental Status Exam Appearance and self-care  Stature:  Average   Weight:  Average weight   Clothing:   Casual   Grooming:  Well-groomed   Cosmetic use:  Age appropriate   Posture/gait:  Normal   Motor activity:  Not Remarkable   Sensorium  Attention:  Normal   Concentration:  Anxiety interferes   Orientation:  Object; Person; Place; Situation; Time   Recall/memory:  Normal   Affect and Mood  Affect:  Depressed   Mood:  Depressed; Anxious   Relating  Eye contact:  Normal   Facial expression:  Depressed; Anxious   Attitude toward examiner:  Cooperative   Thought and Language  Speech flow: Clear and Coherent   Thought content:  Appropriate to Mood and Circumstances   Preoccupation:  None  Hallucinations:  None   Organization:  No data recorded  Affiliated Computer ServicesExecutive Functions  Fund of Knowledge:  Average   Intelligence:  Average   Abstraction:  Normal   Judgement:  Normal   Reality Testing:  Realistic   Insight:  Fair   Decision Making:  Normal   Social Functioning  Social Maturity:  Isolates   Social Judgement:  Normal   Stress  Stressors:  Family conflict; Grief/losses   Coping Ability:  Deficient supports   Skill Deficits:  Interpersonal   Supports:  Family     Religion: Religion/Spirituality Are You A Religious Person?:  (not assessed) How Might This Affect Treatment?: UTA  Leisure/Recreation: Leisure / Recreation Do You Have Hobbies?: No  Exercise/Diet: Exercise/Diet Do You Exercise?: No Have You Gained or Lost A Significant Amount of Weight in the Past Six Months?: No Do You Follow a Special Diet?: No Do You Have Any Trouble Sleeping?: Yes Explanation of Sleeping Difficulties: sleeps 2-3 hours   CCA Employment/Education Employment/Work Situation: Employment / Work Situation Employment situation: Unemployed Patient's job has been impacted by current illness: No What is the longest time patient has a held a job?: not assessed Where was the patient employed at that time?: not assessed Has patient ever been in the Eli Lilly and Companymilitary?:  No  Education: Education Is Patient Currently Attending School?: No Last Grade Completed: 8 [employer] of [employer]: never made it to high school Did Garment/textile technologistYou Graduate From [employer]?: No Did Theme park managerYou Attend College?: No Did Designer, television/film setYou Attend Graduate School?: No Did You Have Any Special Interests In School?: none reported Did You Have An Individualized Education Program (IIEP): No Did You Have Any Difficulty At Progress EnergySchool?: No Patient's Education Has Been Impacted by Current Illness: No   CCA Family/Childhood History Family and Relationship History: Family history Marital status: Married Number of Years Married:  (not assessed) Additional relationship information: Patient states that her husband is not emotional and does not understand her depression Are you sexually active?: Yes What is your sexual orientation?: heterosexual Has your sexual activity been affected by drugs, alcohol, medication, or emotional stress?: not assessed Does patient have children?: Yes How many children?: 2 How is patient's relationship with their children?: Patient states that she is close to her children.  Her son has ADHD and stresses her sometimes  Childhood History:  Childhood History By whom was/is the patient raised?: Both parents Description of patient's relationship with caregiver when they were a child: Patient states that she was close to her parents growing up Patient's description of current relationship with people who raised him/her: Patient states that she is still close to her mother, her father died a couple years ago How were you disciplined when you got in trouble as a child/adolescent?: not assessed Does patient have siblings?: Yes Number of Siblings:  (not assessed) Description of patient's current relationship with siblings: Patient states that she is relatively close to her sisters Did patient suffer any verbal/emotional/physical/sexual abuse as a child?: No Did patient suffer from severe childhood  neglect?: No Has patient ever been sexually abused/assaulted/raped as an adolescent or adult?: No Was the patient ever a victim of a crime or a disaster?: No Witnessed domestic violence?: No Has patient been affected by domestic violence as an adult?: No  Child/Adolescent Assessment:     CCA Substance Use Alcohol/Drug Use: Alcohol / Drug Use Pain Medications: see MAR Prescriptions: see MAR Over the Counter: see MAR History of alcohol / drug use?: No history of alcohol / drug  abuse                         ASAM's:  Six Dimensions of Multidimensional Assessment  Dimension 1:  Acute Intoxication and/or Withdrawal Potential:      Dimension 2:  Biomedical Conditions and Complications:      Dimension 3:  Emotional, Behavioral, or Cognitive Conditions and Complications:     Dimension 4:  Readiness to Change:     Dimension 5:  Relapse, Continued use, or Continued Problem Potential:     Dimension 6:  Recovery/Living Environment:     ASAM Severity Score:    ASAM Recommended Level of Treatment:     Substance use Disorder (SUD)    Recommendations for Services/Supports/Treatments:    DSM5 Diagnoses: Patient Active Problem List   Diagnosis Date Noted  . Major depressive disorder, single episode, severe (HCC)        Referrals to Alternative Service(s): Referred to Alternative Service(s):   Place:   Date:   Time:    Referred to Alternative Service(s):   Place:   Date:   Time:    Referred to Alternative Service(s):   Place:   Date:   Time:    Referred to Alternative Service(s):   Place:   Date:   Time:     Kerby Borner J Pj Zehner, LCAS

## 2021-04-27 ENCOUNTER — Ambulatory Visit: Payer: Self-pay | Admitting: Family Medicine

## 2024-10-24 ENCOUNTER — Encounter: Payer: Self-pay | Admitting: Emergency Medicine

## 2024-10-24 ENCOUNTER — Ambulatory Visit: Admission: EM | Admit: 2024-10-24 | Discharge: 2024-10-24 | Disposition: A | Discharge status: Home / Self Care

## 2024-10-24 DIAGNOSIS — N3000 Acute cystitis without hematuria: Secondary | ICD-10-CM | POA: Diagnosis not present

## 2024-10-24 DIAGNOSIS — M545 Low back pain, unspecified: Secondary | ICD-10-CM | POA: Diagnosis not present

## 2024-10-24 DIAGNOSIS — J029 Acute pharyngitis, unspecified: Secondary | ICD-10-CM

## 2024-10-24 LAB — POCT URINE DIPSTICK
Bilirubin, UA: NEGATIVE
Blood, UA: NEGATIVE
Glucose, UA: NEGATIVE mg/dL
Nitrite, UA: NEGATIVE
POC PROTEIN,UA: 100 — AB
Spec Grav, UA: 1.025 (ref 1.010–1.025)
Urobilinogen, UA: 1 U/dL
pH, UA: 6.5 (ref 5.0–8.0)

## 2024-10-24 LAB — POCT RAPID STREP A (OFFICE): Rapid Strep A Screen: NEGATIVE

## 2024-10-24 MED ORDER — IBUPROFEN 800 MG PO TABS
800.0000 mg | ORAL_TABLET | Freq: Once | ORAL | Status: AC
  Administered 2024-10-24: 800 mg via ORAL

## 2024-10-24 MED ORDER — SULFAMETHOXAZOLE-TRIMETHOPRIM 800-160 MG PO TABS
1.0000 | ORAL_TABLET | Freq: Two times a day (BID) | ORAL | 0 refills | Status: AC
Start: 2024-10-24 — End: 2024-10-29

## 2024-10-24 MED ORDER — KETOROLAC TROMETHAMINE 30 MG/ML IJ SOLN: 30.0000 mg | Freq: Once | INTRAMUSCULAR | Status: DC

## 2024-10-24 NOTE — ED Provider Notes (Signed)
 [facility]    CSN: [id number] Arrival date & time: 10/24/24  1340      History   Chief Complaint Chief Complaint  Patient presents with   Sore Throat   URI    HPI Diana Nunez is a 33 y.o. female.   Patient presents today due to throat pain for the past 12 days.  Patient denies fever, known sick contacts, exudate, or cough.  Patient states she is also experiencing significant back pain.  Patient denies frequent urination, dysuria, or lower abdominal pain.  The history is provided by the patient.  Sore Throat  URI   Past Medical History:  Diagnosis Date   Medical history non-contributory     Patient Active Problem List   Diagnosis Date Noted   Major depressive disorder, single episode, severe (HCC)     Past Surgical History:  Procedure Laterality Date   NO PAST SURGERIES      OB History     Gravida  2   Para  2   Term  2   Preterm      AB      Living  2      SAB      IAB      Ectopic      Multiple  0   Live Births  2            Home Medications    Prior to Admission medications   Medication Sig Start Date End Date Taking? Authorizing Provider  fluconazole (DIFLUCAN) 150 MG tablet Take 150 mg by mouth once. 06/15/24  Yes [provider]  sulfamethoxazole-trimethoprim (BACTRIM DS) 800-160 MG tablet Take 1 tablet by mouth 2 (two) times daily for 5 days. 10/24/24 10/29/24 Yes Andra Krabbe C, PA-C  cetirizine  (ZYRTEC ) 10 MG tablet Take 1 tablet (10 mg total) by mouth daily. Patient not taking: Reported on 04/25/2021 01/26/21   Newlin, Enobong, MD  Ferrous Sulfate (IRON PO) Take 1 tablet by mouth daily.    [provider]  ibuprofen  (ADVIL ) 200 MG tablet Take 200-400 mg by mouth every 6 (six) hours as needed for headache or mild pain.    [provider]  ibuprofen  (ADVIL ) 600 MG tablet Take 1 tablet (600 mg total) by mouth every 6 (six) hours as needed for headache, mild pain,  moderate pain or cramping. Patient not taking: Reported on 04/25/2021 03/10/21   Anyanwu, Ugonna A, MD  levonorgestrel (MIRENA) 20 MCG/24HR IUD 1 each by Intrauterine route once. Patient not taking: No sig reported    [provider]  Multiple Vitamins-Minerals (MULTIVITAMIN WITH MINERALS) tablet Take 1 tablet by mouth daily.    [provider]    Family History History reviewed. No pertinent family history.  Social History Social History   Tobacco Use   Smoking status: Never    Passive exposure: Never   Smokeless tobacco: Never  Vaping Use   Vaping status: Never Used  Substance Use Topics   Alcohol use: No   Drug use: No     Allergies   Patient has no known allergies.   Review of Systems Review of Systems   Physical Exam Triage Vital Signs ED Triage Vitals  Encounter Vitals Group     BP 10/24/24 1410 116/71     Girls Systolic BP Percentile --      Girls Diastolic BP Percentile --      Boys Systolic BP Percentile --      Boys Diastolic BP  Percentile --      Pulse Rate 10/24/24 1410 (!) 104     Resp 10/24/24 1410 18     Temp 10/24/24 1410 98.7 F (37.1 C)     Temp Source 10/24/24 1410 Oral     SpO2 10/24/24 1410 97 %     Weight 10/24/24 1408 201 lb 4.5 oz (91.3 kg)     Height --      Head Circumference --      Peak Flow --      Pain Score 10/24/24 1404 9     Pain Loc --      Pain Education --      Exclude from Growth Chart --    No data found.  Updated Vital Signs BP 116/71 (BP Location: Left Arm)   Pulse (!) 104   Temp 98.7 F (37.1 C) (Oral)   Resp 18   Wt 201 lb 4.5 oz (91.3 kg)   LMP 10/20/2024 (Approximate)   SpO2 97%   Breastfeeding No   BMI 34.55 kg/m   Visual Acuity Right Eye Distance:   Left Eye Distance:   Bilateral Distance:    Right Eye Near:   Left Eye Near:    Bilateral Near:     Physical Exam Vitals and nursing note reviewed.  Constitutional:      General: She is not in acute distress.    Appearance:  Normal appearance. She is not ill-appearing, toxic-appearing or diaphoretic.  HENT:     Nose: Congestion (moderately enlarged turbinates) present. No rhinorrhea.     Mouth/Throat:     Mouth: Mucous membranes are moist.     Pharynx: Oropharynx is clear. No oropharyngeal exudate or posterior oropharyngeal erythema.  Eyes:     General: No scleral icterus. Cardiovascular:     Rate and Rhythm: Normal rate and regular rhythm.     Heart sounds: Normal heart sounds.  Pulmonary:     Effort: Pulmonary effort is normal. No respiratory distress.     Breath sounds: Normal breath sounds. No wheezing or rhonchi.  Abdominal:     General: Abdomen is flat. Bowel sounds are normal.     Palpations: Abdomen is soft.     Tenderness: There is no abdominal tenderness. There is right CVA tenderness and left CVA tenderness.  Musculoskeletal:     Cervical back: Tenderness (mild anterior cervical) present.  Lymphadenopathy:     Cervical: No cervical adenopathy.  Skin:    General: Skin is warm.  Neurological:     Mental Status: She is alert and oriented to person, place, and time.  Psychiatric:        Mood and Affect: Mood normal.        Behavior: Behavior normal.      UC Treatments / Results  Labs (all labs ordered are listed, but only abnormal results are displayed) Labs Reviewed  POCT URINE DIPSTICK - Abnormal; Notable for the following components:      Result Value   Clarity, UA cloudy (*)    Ketones, POC UA small (15) (*)    POC PROTEIN,UA =100 (*)    Leukocytes, UA Small (1+) (*)    All other components within normal limits  POCT RAPID STREP A (OFFICE) - Normal  URINE CULTURE    EKG   Radiology No results found.  Procedures Procedures (including critical care time)  Medications Ordered in UC Medications  ibuprofen  (ADVIL ) tablet 800 mg (800 mg Oral Given 10/24/24 1503)    Initial Impression /  Assessment and Plan / UC Course  I have reviewed the triage vital signs and the  nursing notes.  Pertinent labs & imaging results that were available during my care of the patient were reviewed by me and considered in my medical decision making (see chart for details).     Final Clinical Impressions(s) / UC Diagnoses   Final diagnoses:  Acute pharyngitis, unspecified etiology  Acute midline low back pain, unspecified whether sciatica present  Acute cystitis without hematuria     Discharge Instructions      Please take antibiotics in their entirety, if symptoms worsen and you need to follow-up here or go to the ER.  You may take ibuprofen  and Tylenol  as needed for pain.    ED Prescriptions     Medication Sig Dispense Auth. Provider   sulfamethoxazole-trimethoprim (BACTRIM DS) 800-160 MG tablet Take 1 tablet by mouth 2 (two) times daily for 5 days. 10 tablet Andra Corean BROCKS, PA-C      PDMP not reviewed this encounter.   Andra Corean BROCKS, PA-C 10/24/24 1529

## 2024-10-24 NOTE — ED Triage Notes (Addendum)
 Spanish [employer] - Interpretor ID [id number]  Pt presents c/o sore throat x 12 days. Pt states,  I have a sore throat for more than 12 days as well as sneezing coughing and congestion. My throat is swollen. I tried to use meds from the pharmacy but the pain is not going away. At night it gets really dry and makes me cough a lot. I am also having pain in my lower back. It's pretty painful when I go to stand up. Pt denies emesis and diarrhea.

## 2024-10-24 NOTE — Discharge Instructions (Addendum)
 Please take antibiotics in their entirety, if symptoms worsen and you need to follow-up here or go to the ER.  You may take ibuprofen  and Tylenol  as needed for pain.

## 2024-10-25 LAB — URINE CULTURE: Culture: <10000 — AB

## 2024-10-28 ENCOUNTER — Ambulatory Visit (HOSPITAL_COMMUNITY): Payer: Self-pay

## 2024-10-30 ENCOUNTER — Ambulatory Visit
Admission: EM | Admit: 2024-10-30 | Discharge: 2024-10-30 | Disposition: A | Discharge status: Home / Self Care | Attending: Family Medicine | Admitting: Family Medicine

## 2024-10-30 DIAGNOSIS — M545 Low back pain, unspecified: Secondary | ICD-10-CM | POA: Diagnosis not present

## 2024-10-30 DIAGNOSIS — M62830 Muscle spasm of back: Secondary | ICD-10-CM | POA: Diagnosis not present

## 2024-10-30 MED ORDER — KETOROLAC TROMETHAMINE 30 MG/ML IJ SOLN
30.0000 mg | Freq: Once | INTRAMUSCULAR | Status: AC
  Administered 2024-10-30: 30 mg via INTRAMUSCULAR

## 2024-10-30 MED ORDER — METHOCARBAMOL 500 MG PO TABS: 500.0000 mg | ORAL_TABLET | Freq: Two times a day (BID) | ORAL | 0 refills | Status: AC | PRN

## 2024-10-30 MED ORDER — DEXAMETHASONE SOD PHOSPHATE PF 10 MG/ML IJ SOLN
10.0000 mg | Freq: Once | INTRAMUSCULAR | Status: AC
  Administered 2024-10-30: 10 mg via INTRAMUSCULAR

## 2024-10-30 NOTE — ED Triage Notes (Signed)
 Patient reports being here for Back Pain. She states she was here last week for the same reason. She states the pain is the same or worse in the lower back especially when getting up or leaning down. Pain @ that time is in Lower Right/Left/Mid areas of back with no radiation of pain. No recent constipation or history of. No urinary complaints or concerns. No relation to specific injury or strain.

## 2024-10-30 NOTE — ED Provider Notes (Signed)
 [facility]   [id number] 10/30/24 Arrival Time: 1016  ASSESSMENT & PLAN:  1. Acute bilateral low back pain without sciatica   2. Lumbar paraspinal muscle spasm    Likely MSK etiology. Able to ambulate here and hemodynamically stable.  Trial of: Meds ordered this encounter  Medications   dexamethasone (DECADRON) injection 10 mg   ketorolac (TORADOL) 30 MG/ML injection 30 mg   methocarbamol (ROBAXIN) 500 MG tablet    Sig: Take 1-2 tablets (500-1,000 mg total) by mouth 2 (two) times daily as needed for muscle spasms.    Dispense:  20 tablet    Refill:  0   Medication sedation precautions given. Encourage ROM/movement as tolerated.  Recommend:  Follow-up Information     [facility]).   Specialty: Urgent Care Why: If worsening or failing to improve as anticipated. Contact information: [address] [place]  [address] [phone]                Reviewed expectations re: course of current medical issues. Questions answered. Outlined signs and symptoms indicating need for more acute intervention. Patient verbalized understanding. After Visit Summary given.   SUBJECTIVE: History from: patient. Spanish video interpreter used. Seen here on 10/24/2024; note reviewed. Urine culture negative. Diana Nunez is a 33 y.o. female who presents with complaint of persistent low back pain; x 2 weeks; sore and aching, feeling tight at times. Worse with certain movements. Tylenol  without much relief. Reports normal bowel/bladder habits. Denies extremity sensation changes or weakness.  Denies back trauma.   OBJECTIVE:  Vitals:   10/30/24 1030 10/30/24 1035  BP:  122/77  Pulse:  (!) 109  Resp:  18  Temp:  98.8 F (37.1 C)  TempSrc:  Oral  SpO2:  97%  Weight: 91.3 kg   Height: 5' 4 (1.626 m)     General appearance: alert; no distress HEENT: [place]; AT Neck: supple with FROM;  without midline tenderness CV: regular Lungs: unlabored respirations; speaks full sentences without difficulty Abdomen: soft, non-tender; non-distended Back: poorly localized tenderness to palpation over bilateral lumbar paraspinal musculature; FROM at waist; bruising: none; without midline tenderness Extremities: without edema; symmetrical without gross deformities; normal ROM of bilateral LE Skin: warm and dry Neurologic: normal gait; normal sensation and strength of bilateral LE Psychological: alert and cooperative; normal mood and affect  Labs:  Labs Reviewed - No data to display  Imaging: No results found.  No Known Allergies  Past Medical History:  Diagnosis Date   Medical history non-contributory    Social History   Socioeconomic History   Marital status: Married    Spouse name: Not on file   Number of children: Not on file   Years of education: Not on file   Highest education level: Not on file  Occupational History   Not on file  Tobacco Use   Smoking status: Never    Passive exposure: Never   Smokeless tobacco: Never  Vaping Use   Vaping status: Never Used  Substance and Sexual Activity   Alcohol use: No   Drug use: No   Sexual activity: Yes    Birth control/protection: I.U.D., None  Other Topics Concern   Not on file  Social History Narrative   Not on file   Social Drivers of Health   Financial Resource Strain: Not on file  Food Insecurity: No Food Insecurity (03/10/2021)   Hunger Vital Sign    Worried About Running Out of  Food in the Last Year: Never true    Ran Out of Food in the Last Year: Never true  Transportation Needs: No Transportation Needs (03/10/2021)   PRAPARE - Administrator, Civil Service (Medical): No    Lack of Transportation (Non-Medical): No  Physical Activity: Not on file  Stress: Not on file  Social Connections: Unknown (06/28/2023)   Received from [facility]   Social Network    Social Network: Not on file   Intimate Partner Violence: Unknown (06/28/2023)   Received from [facility]   HITS    Physically Hurt: Not on file    Insult or Talk Down To: Not on file    Threaten Physical Harm: Not on file    Scream or Curse: Not on file   No family history on file. Past Surgical History:  Procedure Laterality Date   NO PAST SURGERIES        Rolinda Rogue, MD 10/30/24 1323

## 2024-10-30 NOTE — ED Triage Notes (Signed)
 Patient declined Tylenol  prior to seeing provider.

## 2024-10-30 NOTE — Discharge Instructions (Addendum)
 HOME CARE INSTRUCTIONS: For many people, back pain returns. Since low back pain is rarely dangerous, it is often a condition that people can learn to manage on their own. Please remain active. It is stressful on the back to sit or stand in one place. Do not sit, drive, or stand in one place for more than 30 minutes at a time. Take short walks on level surfaces as soon as pain allows. Try to increase the length of time you walk each day. Do not stay in bed. Resting more than 1 or 2 days can delay your recovery. Do not avoid exercise or work. Your body is made to move. It is not dangerous to be active, even though your back may hurt. Your back will likely heal faster if you return to being active before your pain is gone. Over-the-counter medicines to reduce pain and inflammation are often the most helpful.  SEEK MEDICAL CARE IF: You have pain that is not relieved with rest or medicine. You have pain that does not improve in 1 week. You have new symptoms. You are generally not feeling well.  SEEK IMMEDIATE MEDICAL CARE IF: You have pain that radiates from your back into your legs. You develop new bowel or bladder control problems. You have unusual weakness or numbness in your arms or legs. You develop nausea or vomiting. You develop abdominal pain. You feel faint.   Meds ordered this encounter  Medications   dexamethasone (DECADRON) injection 10 mg   ketorolac (TORADOL) 30 MG/ML injection 30 mg   methocarbamol (ROBAXIN) 500 MG tablet    Sig: Take 1-2 tablets (500-1,000 mg total) by mouth 2 (two) times daily as needed for muscle spasms.    Dispense:  20 tablet    Refill:  0

## 2024-10-30 NOTE — ED Notes (Signed)
 Due to language barrier, an interpreter was present during the history-taking and subsequent discussion (and for part of the physical exam) with this patient. Albert. Number: [id number].
